# Patient Record
Sex: Female | Born: 1935 | Race: White | Hispanic: No | Marital: Married | Smoking: Never smoker
Health system: Southern US, Community
[De-identification: ages and names within clinical notes are randomized; demographics above are authoritative.]

## PROBLEM LIST (undated history)

## (undated) DIAGNOSIS — I10 Essential (primary) hypertension: Secondary | ICD-10-CM

## (undated) DIAGNOSIS — E039 Hypothyroidism, unspecified: Secondary | ICD-10-CM

---

## 1998-12-14 ENCOUNTER — Other Ambulatory Visit: Admission: RE | Admit: 1998-12-14 | Discharge: 1998-12-14 | Payer: Self-pay | Admitting: Internal Medicine

## 2000-01-01 ENCOUNTER — Other Ambulatory Visit: Admission: RE | Admit: 2000-01-01 | Discharge: 2000-01-01 | Payer: Self-pay | Admitting: Internal Medicine

## 2001-01-12 ENCOUNTER — Other Ambulatory Visit: Admission: RE | Admit: 2001-01-12 | Discharge: 2001-01-12 | Payer: Self-pay | Admitting: Internal Medicine

## 2006-01-01 ENCOUNTER — Encounter: Admission: RE | Admit: 2006-01-01 | Discharge: 2006-01-01 | Payer: Self-pay | Admitting: Internal Medicine

## 2008-03-22 ENCOUNTER — Encounter: Admission: RE | Admit: 2008-03-22 | Discharge: 2008-03-22 | Payer: Self-pay | Admitting: Internal Medicine

## 2008-10-01 ENCOUNTER — Encounter (INDEPENDENT_AMBULATORY_CARE_PROVIDER_SITE_OTHER): Payer: Self-pay | Admitting: Emergency Medicine

## 2008-10-01 ENCOUNTER — Ambulatory Visit: Payer: Self-pay | Admitting: Vascular Surgery

## 2008-10-01 ENCOUNTER — Inpatient Hospital Stay (HOSPITAL_COMMUNITY): Admission: EM | Admit: 2008-10-01 | Discharge: 2008-10-04 | Payer: Self-pay | Admitting: Emergency Medicine

## 2008-10-18 ENCOUNTER — Inpatient Hospital Stay (HOSPITAL_COMMUNITY): Admission: EM | Admit: 2008-10-18 | Discharge: 2008-10-23 | Payer: Self-pay | Admitting: Emergency Medicine

## 2008-10-20 ENCOUNTER — Ambulatory Visit: Payer: Self-pay | Admitting: Infectious Diseases

## 2009-01-28 ENCOUNTER — Inpatient Hospital Stay (HOSPITAL_COMMUNITY): Admission: EM | Admit: 2009-01-28 | Discharge: 2009-01-31 | Payer: Self-pay | Admitting: Emergency Medicine

## 2010-10-21 ENCOUNTER — Encounter: Payer: Self-pay | Admitting: Internal Medicine

## 2011-01-08 LAB — BASIC METABOLIC PANEL
BUN: 23 mg/dL (ref 6–23)
Calcium: 9.1 mg/dL (ref 8.4–10.5)
Calcium: 9.6 mg/dL (ref 8.4–10.5)
Chloride: 107 mEq/L (ref 96–112)
Chloride: 113 mEq/L — ABNORMAL HIGH (ref 96–112)
Creatinine, Ser: 1.29 mg/dL — ABNORMAL HIGH (ref 0.4–1.2)
GFR calc Af Amer: 47 mL/min — ABNORMAL LOW (ref >60)
GFR calc Af Amer: 56 mL/min — ABNORMAL LOW (ref >60)
GFR calc non Af Amer: 39 mL/min — ABNORMAL LOW (ref >60)
GFR calc non Af Amer: 46 mL/min — ABNORMAL LOW (ref >60)
Glucose, Bld: 120 mg/dL — ABNORMAL HIGH (ref 70–99)
Potassium: 3.7 mEq/L (ref 3.5–5.1)

## 2011-01-08 LAB — CARDIAC PANEL(CRET KIN+CKTOT+MB+TROPI)
CK, MB: 1.1 ng/mL (ref 0.3–4.0)
CK, MB: 1.4 ng/mL (ref 0.3–4.0)
Relative Index: INVALID (ref 0.0–2.5)
Relative Index: INVALID (ref 0.0–2.5)
Total CK: 52 U/L (ref 7–177)
Total CK: 53 U/L (ref 7–177)
Troponin I: 0.03 ng/mL (ref 0.00–0.06)

## 2011-01-08 LAB — URINE MICROSCOPIC-ADD ON

## 2011-01-08 LAB — URINALYSIS, ROUTINE W REFLEX MICROSCOPIC
Glucose, UA: NEGATIVE mg/dL
Nitrite: NEGATIVE
Urobilinogen, UA: 0.2 mg/dL (ref 0.0–1.0)

## 2011-01-08 LAB — CBC
Hemoglobin: 12.1 g/dL (ref 12.0–15.0)
MCHC: 33.6 g/dL (ref 30.0–36.0)
MCV: 89.1 fL (ref 78.0–100.0)
Platelets: 231 10*3/uL (ref 150–400)
RBC: 4.06 MIL/uL (ref 3.87–5.11)
RBC: 4.42 MIL/uL (ref 3.87–5.11)
RDW: 15.3 % (ref 11.5–15.5)
WBC: 17.5 10*3/uL — ABNORMAL HIGH (ref 4.0–10.5)

## 2011-01-08 LAB — DIFFERENTIAL
Basophils Absolute: 0 10*3/uL (ref 0.0–0.1)
Basophils Relative: 0 % (ref 0–1)
Eosinophils Absolute: 0 10*3/uL (ref 0.0–0.7)
Eosinophils Relative: 0 % (ref 0–5)
Monocytes Absolute: 0.3 10*3/uL (ref 0.1–1.0)
Monocytes Absolute: 1.3 10*3/uL — ABNORMAL HIGH (ref 0.1–1.0)
Monocytes Relative: 3 % (ref 3–12)
Monocytes Relative: 7 % (ref 3–12)
Neutro Abs: 14.9 10*3/uL — ABNORMAL HIGH (ref 1.7–7.7)

## 2011-01-08 LAB — CULTURE, BLOOD (ROUTINE X 2)

## 2011-01-08 LAB — URIC ACID
Uric Acid, Serum: 2.5 mg/dL (ref 2.4–7.0)
Uric Acid, Serum: 3.1 mg/dL (ref 2.4–7.0)

## 2011-01-08 LAB — TROPONIN I: Troponin I: 0.03 ng/mL (ref 0.00–0.06)

## 2011-01-14 LAB — CBC
HCT: 32.3 % — ABNORMAL LOW (ref 36.0–46.0)
HCT: 33.4 % — ABNORMAL LOW (ref 36.0–46.0)
HCT: 35.6 % — ABNORMAL LOW (ref 36.0–46.0)
HCT: 28.7 % — ABNORMAL LOW (ref 36.0–46.0)
HCT: 30.4 % — ABNORMAL LOW (ref 36.0–46.0)
HCT: 32 % — ABNORMAL LOW (ref 36.0–46.0)
HCT: 34.4 % — ABNORMAL LOW (ref 36.0–46.0)
Hemoglobin: 11 g/dL — ABNORMAL LOW (ref 12.0–15.0)
Hemoglobin: 11.7 g/dL — ABNORMAL LOW (ref 12.0–15.0)
Hemoglobin: 11.7 g/dL — ABNORMAL LOW (ref 12.0–15.0)
Hemoglobin: 10.1 g/dL — ABNORMAL LOW (ref 12.0–15.0)
Hemoglobin: 10.6 g/dL — ABNORMAL LOW (ref 12.0–15.0)
Hemoglobin: 9.9 g/dL — ABNORMAL LOW (ref 12.0–15.0)
MCHC: 32.8 g/dL (ref 30.0–36.0)
MCHC: 32.9 g/dL (ref 30.0–36.0)
MCHC: 33.3 g/dL (ref 30.0–36.0)
MCHC: 33.2 g/dL (ref 30.0–36.0)
MCV: 92.1 fL (ref 78.0–100.0)
MCV: 92.3 fL (ref 78.0–100.0)
MCV: 89.7 fL (ref 78.0–100.0)
MCV: 90.8 fL (ref 78.0–100.0)
MCV: 91.4 fL (ref 78.0–100.0)
Platelets: 260 10*3/uL (ref 150–400)
Platelets: 333 10*3/uL (ref 150–400)
Platelets: 309 10*3/uL (ref 150–400)
RBC: 3.87 MIL/uL (ref 3.87–5.11)
RBC: 3.3 MIL/uL — ABNORMAL LOW (ref 3.87–5.11)
RBC: 3.79 MIL/uL — ABNORMAL LOW (ref 3.87–5.11)
RDW: 13.9 % (ref 11.5–15.5)
RDW: 14.1 % (ref 11.5–15.5)
RDW: 14.2 % (ref 11.5–15.5)
RDW: 15.2 % (ref 11.5–15.5)
RDW: 15.3 % (ref 11.5–15.5)
RDW: 15.4 % (ref 11.5–15.5)
WBC: 10.9 10*3/uL — ABNORMAL HIGH (ref 4.0–10.5)
WBC: 11.6 10*3/uL — ABNORMAL HIGH (ref 4.0–10.5)
WBC: 15 10*3/uL — ABNORMAL HIGH (ref 4.0–10.5)
WBC: 18 10*3/uL — ABNORMAL HIGH (ref 4.0–10.5)

## 2011-01-14 LAB — DIFFERENTIAL
Basophils Absolute: 0 10*3/uL (ref 0.0–0.1)
Basophils Absolute: 0 10*3/uL (ref 0.0–0.1)
Basophils Relative: 0 % (ref 0–1)
Basophils Relative: 0 % (ref 0–1)
Eosinophils Absolute: 0 10*3/uL (ref 0.0–0.7)
Eosinophils Relative: 0 % (ref 0–5)
Eosinophils Relative: 0 % (ref 0–5)
Lymphocytes Relative: 7 % — ABNORMAL LOW (ref 12–46)
Lymphocytes Relative: 8 % — ABNORMAL LOW (ref 12–46)
Lymphocytes Relative: 6 % — ABNORMAL LOW (ref 12–46)
Lymphs Abs: 1.4 10*3/uL (ref 0.7–4.0)
Monocytes Absolute: 1.5 10*3/uL — ABNORMAL HIGH (ref 0.1–1.0)
Monocytes Absolute: 1.8 10*3/uL — ABNORMAL HIGH (ref 0.1–1.0)
Monocytes Absolute: 1.4 10*3/uL — ABNORMAL HIGH (ref 0.1–1.0)
Monocytes Relative: 10 % (ref 3–12)
Neutro Abs: 14.4 10*3/uL — ABNORMAL HIGH (ref 1.7–7.7)

## 2011-01-14 LAB — HEPATIC FUNCTION PANEL
AST: 43 U/L — ABNORMAL HIGH (ref 0–37)
Bilirubin, Direct: 0.2 mg/dL (ref 0.0–0.3)
Total Bilirubin: 0.9 mg/dL (ref 0.3–1.2)

## 2011-01-14 LAB — COMPREHENSIVE METABOLIC PANEL
AST: 16 U/L (ref 0–37)
Alkaline Phosphatase: 76 U/L (ref 39–117)
BUN: 20 mg/dL (ref 6–23)
CO2: 23 mEq/L (ref 19–32)
Chloride: 103 mEq/L (ref 96–112)
Creatinine, Ser: 1.42 mg/dL — ABNORMAL HIGH (ref 0.4–1.2)
GFR calc Af Amer: 42 mL/min — ABNORMAL LOW (ref >60)
GFR calc non Af Amer: 35 mL/min — ABNORMAL LOW (ref >60)
Potassium: 3.8 mEq/L (ref 3.5–5.1)
Total Bilirubin: 1.2 mg/dL (ref 0.3–1.2)

## 2011-01-14 LAB — SEDIMENTATION RATE
Sed Rate: 40 mm/hr — ABNORMAL HIGH (ref 0–22)
Sed Rate: 80 mm/hr — ABNORMAL HIGH (ref 0–22)

## 2011-01-14 LAB — BASIC METABOLIC PANEL
BUN: 35 mg/dL — ABNORMAL HIGH (ref 6–23)
BUN: 36 mg/dL — ABNORMAL HIGH (ref 6–23)
BUN: 50 mg/dL — ABNORMAL HIGH (ref 6–23)
CO2: 23 mEq/L (ref 19–32)
Calcium: 8.7 mg/dL (ref 8.4–10.5)
Calcium: 8.6 mg/dL (ref 8.4–10.5)
Chloride: 105 mEq/L (ref 96–112)
Chloride: 106 mEq/L (ref 96–112)
Chloride: 108 mEq/L (ref 96–112)
Creatinine, Ser: 1.29 mg/dL — ABNORMAL HIGH (ref 0.4–1.2)
Creatinine, Ser: 1.64 mg/dL — ABNORMAL HIGH (ref 0.4–1.2)
GFR calc Af Amer: 28 mL/min — ABNORMAL LOW (ref >60)
GFR calc Af Amer: 47 mL/min — ABNORMAL LOW (ref >60)
GFR calc non Af Amer: 23 mL/min — ABNORMAL LOW (ref >60)
GFR calc non Af Amer: 24 mL/min — ABNORMAL LOW (ref >60)
GFR calc non Af Amer: 28 mL/min — ABNORMAL LOW (ref >60)
GFR calc non Af Amer: 39 mL/min — ABNORMAL LOW (ref >60)
Glucose, Bld: 115 mg/dL — ABNORMAL HIGH (ref 70–99)
Glucose, Bld: 150 mg/dL — ABNORMAL HIGH (ref 70–99)
Glucose, Bld: 209 mg/dL — ABNORMAL HIGH (ref 70–99)
Potassium: 3.4 mEq/L — ABNORMAL LOW (ref 3.5–5.1)
Potassium: 3.8 mEq/L (ref 3.5–5.1)
Potassium: 3.9 mEq/L (ref 3.5–5.1)
Sodium: 135 mEq/L (ref 135–145)
Sodium: 137 mEq/L (ref 135–145)
Sodium: 137 mEq/L (ref 135–145)

## 2011-01-14 LAB — CORTISOL: Cortisol, Plasma: 24.1 ug/dL

## 2011-01-14 LAB — POCT I-STAT, CHEM 8
Chloride: 102 mEq/L (ref 96–112)
HCT: 37 % (ref 36.0–46.0)
Hemoglobin: 12.6 g/dL (ref 12.0–15.0)
Potassium: 3.4 mEq/L — ABNORMAL LOW (ref 3.5–5.1)
Sodium: 139 mEq/L (ref 135–145)

## 2011-01-14 LAB — CULTURE, BLOOD (ROUTINE X 2)

## 2011-01-14 LAB — TSH
TSH: 3.671 u[IU]/mL (ref 0.350–4.500)
TSH: 1.295 u[IU]/mL (ref 0.350–4.500)

## 2011-01-14 LAB — PROTIME-INR: INR: 1.1 (ref 0.00–1.49)

## 2011-01-14 LAB — GLUCOSE, CAPILLARY

## 2011-01-14 LAB — BRAIN NATRIURETIC PEPTIDE: Pro B Natriuretic peptide (BNP): 62.7 pg/mL (ref 0.0–100.0)

## 2011-01-14 LAB — ANA: Anti Nuclear Antibody(ANA): NEGATIVE

## 2011-01-14 LAB — URINE CULTURE

## 2011-01-14 LAB — APTT: aPTT: 38 seconds — ABNORMAL HIGH (ref 24–37)

## 2011-01-14 LAB — RHEUMATOID FACTOR: Rhuematoid fact SerPl-aCnc: <20 IU/mL (ref 0–20)

## 2011-02-12 NOTE — Discharge Summary (Signed)
[name], [name]           ACCOUNT NO.:  [id number]   MEDICAL RECORD NO.:  [id number]          PATIENT TYPE:  INP   LOCATION:  [id number]                         FACILITY:  [facility]   PHYSICIAN:  Dwaine Deter, M.D.DATE OF BIRTH:  [birth year]   DATE OF ADMISSION:  01/28/2009  DATE OF DISCHARGE:  01/31/2009                               DISCHARGE SUMMARY   DISCHARGE DIAGNOSES:  1. Generalized weakness - improved.  Question etiology.  May have been      medication related.  Crestor on hold and Uloric reduced to 20 mg      daily for gout.  2. Leukocytosis, resolved.  White count 17,500 on admission and on      second day of the admission 8900.  3. Nocturnal confusion - this occurred night prior to discharge.      Improved on the morning of discharge.  Likely sundowning.  4. History of gout with some erythema and warmth to the great toes      bilaterally, which improved during the hospitalization.  The      patient had one dose of prednisone 20 mg on Jan 30, 2009, then 10 mg      on Jan 31, 2009, to take for 3 more days.  5. Hypertension - well controlled on Toprol.  Lisinopril was held.  6. Bradycardia - improved with reduction of Toprol to 50 mg daily with      pulse on discharge of 81.   The patient also has a history of spinal stenosis and gait disorder  secondary to that.  She has chronic renal insufficiency with discharge  BUN 16 and creatinine 1.11.   HOSPITAL COURSE:  [name] is a very pleasant 77 year old  female with history of spinal stenosis and has had 2 admissions earlier  this year for gouty flares.  At each admission, there was a question  whether this might be cellulitis and after Infectious Disease  consultation with Dr. Lars Mage, it was felt that she was likely  having gouty flares.  She presented on Jan 28, 2009, with an apparent  progressive weakness and could not get out of the bed on the morning of  admission.  She had had decreased p.o.  intake and she apparently had had  an upper respiratory virus couple of weeks prior to admission.  She  denied any fevers or chills.  No nausea, vomiting, or diarrhea, just  weakness.  She also denied chest pain, shortness of breath, abdominal  pain, or any focal neurologic symptoms, cough or dysuria.  She had had  some loose stools a week prior to admission, which resolved.   The patient was seen in the [facility] and found to have  a white count of 17,500 with an increased absolute neutrophil count.  Creatinine was mildly elevated at 1.21.  She was given prednisone p.o.  x1 at the emergency room and was admitted.   On admission, her lisinopril was held.  Vital signs were stable.  Crestor, Toprol, aspirin, calcium, and Uloric were continued.   With IV hydration, she rapidly improved and mild foot  pain was  persistent at the MTP joints with some very minimal swelling.  She was  given prednisone 20 mg x1 on Jan 30, 2009, and colchicine was started at  0.6 mg daily.  On the night of Jan 30, 2009, she became confused but  vital signs were stable.  There was some paranoia.  She responded very  well to Seroquel 25 mg p.o. x1 and is calm at the time of discharge on  Jan 31, 2009.   The patient will be discharged home in improved condition and she likely  was having some sundowning with the addition of prednisone therapy as  well.   CONDITION ON DISCHARGE:  Improved with questionable etiology for  confusion and weakness but could have been medication related and her  Toprol and Uloric have been decreased and her Crestor has been placed on  hold.  I will see her back in the office in 1 week and p.r.n.      Dwaine Deter, M.D.  Electronically Signed     RNG/MEDQ  D:  01/31/2009  T:  01/31/2009  Job:  [id number]

## 2011-02-12 NOTE — H&P (Signed)
NAMEMAYRELI, PFUHL NO.:  [id number]   MEDICAL RECORD NO.:  [id number]          PATIENT TYPE:  EMS   LOCATION:  ED                           FACILITY:  [facility]   PHYSICIAN:  Irine Seal, MD    DATE OF BIRTH:  10-Apr-1918   DATE OF ADMISSION:  01/28/2009  DATE OF DISCHARGE:                              HISTORY & PHYSICAL   PRIMARY CARE PHYSICIAN:  Dwaine Deter, M.D. of [place].   HISTORY OF PRESENT ILLNESS:  [name] is an 77 year old white  female with a history of spinal stenosis, 2 prior admissions in January  of this year for gouty attack/flares, history of hypertension,  hyperlipidemia, gait disorder, presented to the ED with generalized  weakness.  Per husband, the patient just could not get out of bed this  morning and was just globally weak.  The patient had had some decreased  p.o. intake and had an upper respiratory viral infection 2 weeks prior  to admission.  The patient denies any fevers.  No chills, no nausea, no  vomiting, no diarrhea, no chest pain, no shortness of breath, no  abdominal pain, no focal neurological symptoms, no cough, no dysuria, no  headaches.  The patient does endorse some loose stools 1 week prior to  admission which have since resolved.  Per family, the patient has not  had a bowel movement in 2 days.  The patient was seen in the ED.  Chest  x-ray done was negative.  CBC showed a white count of 17.5 with an ANC  of 14.9, and was otherwise within normal limits.  BMET obtained showed a  creatinine of 1.21 but was otherwise within normal limits.  Urinalysis  done was bland.  The patient was given 40 mg of prednisone orally x1 and  will call to admit the patient for further evaluation and  recommendations.   ALLERGIES:  No known drug allergies.   PAST MEDICAL HISTORY:  1. Prior hospitalizations in January 2010 for probable gouty      attacks/cellulitis.  2. Hypertension.  3. Hyperlipidemia.  4.  Gait disorder.  5. History of spinal stenosis/degenerative joint disease.  6. History of mild renal insufficiency with a baseline creatinine 1.5-      1.7.  7. History of gout.   HOME MEDICATIONS:  1. Crestor 5 mg p.o. daily.  2. Lisinopril 10 mg p.o. daily.  3. Toprol XL 100 mg p.o. daily.  4. Aspirin 81 mg p.o. daily.  5. Fish oil 2400 units one capsule daily.  6. Calcium with vitamin D daily.  7. Uloric 40 mg p.o. daily.   SOCIAL HISTORY:  The patient is married, lives in [place] with her  husband.  No current tobacco use, quit 25 years ago.  Social alcohol  use.  No IV drug use.   FAMILY HISTORY:  Noncontributory.   REVIEW OF SYSTEMS:  As per HPI, otherwise negative.   PHYSICAL EXAMINATION:  VITAL SIGNS:  Temperature 99.2, blood pressure  145/68, pulse of 66, respirations 18, saturations 96% on room air.  GENERAL:  Patient lying on gurney in no apparent distress.  HEENT:  Normocephalic, atraumatic.  Pupils equal, round and reactive to  light and accommodation.  Extraocular movements intact.  Oropharynx is  clear.  No lesions, no exudates.  NECK:  Supple.  No lymphadenopathy.  Dry mucous membranes.  RESPIRATORY:  Lungs are clear to auscultation bilaterally.  CARDIOVASCULAR:  Regular rate and rhythm.  No murmurs, rubs or gallops.  ABDOMEN:  Soft, nontender, nondistended.  Positive bowel sounds.  EXTREMITIES:  No clubbing, cyanosis.  Bilateral MTP joints are  erythematous, slightly tender to palpation and warm.  NEUROLOGICAL:  The patient is alert and oriented x3.  Cranial nerves II-  XII are grossly intact.  No focal deficits.   ADMISSION LABS:  CBC:  White count 17.5, hemoglobin 13.2, hematocrit  39.6, platelet count of 231, ANC of 14.9.  Basic metabolic profile:  Sodium 138, potassium 3.7, chloride 105, bicarb 25, glucose 120, BUN 16,  creatinine 1.21 and a calcium of 9.6.  UA was yellow, clear, specific  gravity 1.018, pH of 6.5, glucose negative, bilirubin negative,  trace  ketones, blood small, protein greater than 300, urobilinogen 0.2,  nitrite negative, leukocytes negative.  Urine microscopy:  RBCs 0-1.  Chest x-ray obtained showed cardiomegaly with chronic interstitial  changes.  EKG with normal sinus rhythm with Q-waves in leads III and  aVF.   ASSESSMENT AND PLAN:  [name] is an 77 year old female with  history of gout, hyperlipidemia, hypertension, gait disorder, spinal  stenosis, mild renal insufficiency presenting to the ED with a  generalized weakness.  1. Generalized weakness, questionable etiology.  Differential includes      acute coronary syndrome versus debility versus an infectious      etiology versus endocrine etiology.  We will cycle cardiac enzymes      q.8 h x3.  Check a TSH.  Check blood cultures x2.  Check      orthostatics.  Hydrate with IV fluids, PT, OT and follow.  2. Leukocytosis likely secondary to gout (as the patient does have      bilateral metatarsophalangeal great toe joints are erythematous,      warm with some tenderness to palpation) versus septic joint versus      bacteremia.  Urinalysis was negative.  Chest x-ray was negative.      Check blood cultures x2.  Check a uric acid level.  Get x-rays of      the bilateral feet to rule out an effusion.  Empiric IV vancomycin      and Indocin for now.  Continue home dose of Uloric and follow.  3. Mild dehydration.  IV fluids.  4. Hypertension.  Continue home dose of Toprol XL.  We will hold      lisinopril for now.  5. Chronic renal insufficiency.  Baseline creatinine is 1.5-1.7,      currently at baseline.  We will follow.  6. Hyperlipidemia.  Continue home dose of Crestor.  7. Spinal stenosis.  8. Gait disorder.  9. Prophylaxis:  Protonix for GI prophylaxis.  Lovenox for DVT      prophylaxis.   It has been a pleasure taking care of [name].      Irine Seal, MD  Electronically Signed     DT/MEDQ  D:  01/28/2009  T:   01/28/2009  Job:  [id number]   cc:   Dwaine Deter, M.D.  Fax: [phone]

## 2011-02-12 NOTE — H&P (Signed)
[name], [name]           ACCOUNT NO.:  [id number]   MEDICAL RECORD NO.:  [id number]          PATIENT TYPE:  EMS   LOCATION:  ED                           [facility]ACILITY:  [facility]   PHYSICIAN:  Corinna L. Conley Canal, MDDATE OF BIRTH:  30-May-1918   DATE OF ADMISSION:  10/01/2008  DATE OF DISCHARGE:                              HISTORY & PHYSICAL   CHIEF COMPLAINT:  Cannot walk.   HISTORY OF PRESENT ILLNESS:  Ms. Brinley is a pleasant 77 year old white  female who presents with sudden onset of left ankle pain.  She reports  that it started all of a sudden last night.  She is unable to bear  weight on her left leg due to the ankle pain.  She has had no fevers or  chills.  Her husband reports that previously her right foot and ankle  were swollen but there was no pain.  She, at some point over the past 6  months, has been diagnosed with presumed gout.  Apparently she has never  had arthrocentesis.  She was placed on an unknown gout medication which  she has since stopped.  She was given samples of something and they have  run out.  The patient was also reportedly started on a course of unknown  antibiotic for the right ankle and foot swelling.  The patient also has  reportedly been seen in the office for her alternating foot and ankle  pain.  The husband reports that her appetite has also been poor.  She  has been very weak and has had difficulty walking even prior to this  acute attack last night.  She had been walking without a walker but now  requires a walker to get around.  I see that she has had a uric acid  level drawn as an outpatient but no other labs that I know of.  Outpatient records are unavailable.   PAST MEDICAL HISTORY:  Hypertension, hyperlipidemia, presumed gout.   MEDICATIONS:  Crestor 5 mg a day, lisinopril/hydrochlorothiazide 20/12.5  mg a day, Toprol XL 100 mg a day, aspirin 81 mg a day, a multivitamin a  day, fish oil capsules, calcium with vitamin D.   SOCIAL  HISTORY:  The patient is married and here with her husband.  She  quit smoking 25 years ago and used to smoke less than half a pack a day.  She drinks socially occasionally.   FAMILY HISTORY:  Noncontributory.   REVIEW OF SYSTEMS:  As above otherwise negative.   On physical examination temperature is 98.1, blood pressure 129/72,  pulse 83, respiratory rate 20, oxygen saturation 99% on room air.  In general the patient is an elderly white female in no acute distress.  HEENT:  Normocephalic, atraumatic.  Pupils equal, round, reactive to  light.  Sclerae nonicteric.  Moist mucous membranes.  Neck is supple.  No lymphadenopathy.  Lungs are clear to auscultation bilaterally without wheezes, rhonchi or  rales.  CARDIOVASCULAR:  Regular rate and rhythm without murmurs, gallops or  rubs.  ABDOMEN:  Soft, nontender, nondistended.  GU and rectal were deferred.  EXTREMITIES:  The right ankle,  she reports pain with flexion in the  Achilles area.  She has some erythema over her right ankle and right  first MTP joint but no tenderness to palpation.  Her left ankle has pain  with flexion posteriorly, minimal tenderness over the ankle joint,  minimal tenderness over the left third MTP, slight nonpitting edema of  the ankle and distal lower extremity.  No erythema, no warmth.  I did  not have the patient walk but according to the ED physician, she cried  out in pain with any weightbearing to the left leg.  Motor strength is  5/5 throughout.  NEUROLOGIC:  Alert and oriented.  Cranial nerves and sensory motor exam  were intact.  Decreased but symmetric deep tendon reflexes.  SKIN:  No rash.  No purpura.  Otherwise as above.   LABS:  White blood cell count is 17,000, hemoglobin 11.7, hematocrit  35.6, platelet count 331, 82% neutrophils, 8% lymphocytes, 10%  monocytes, 0 eosinophils, erythrocyte sedimentation rate is 85, PTT is  38, INR 1.3.  Sodium 139, potassium 3.4, chloride 102, glucose 131,  BUN  29, creatinine 2.0.  Her baseline creatinine per outpatient labs is  about 1.6.  Bicarbonate 28.  Uric acid 5.3.  BNP 62.7.  Doppler of both  legs negative for DVT.  Chest x-ray shows probable COPD, nothing acute,  borderline cardiomegaly.  Official reading of the left ankle x-ray is  pending but to my view shows no fracture.   ASSESSMENT/PLAN:  1. Acute left ankle pain with difficulty walking:  Certainly infection      is a possibility as well as gout.  She is afebrile, has no erythema      or warmth to the joint but does have a leukocytosis.  The ED      physician recommended aspiration of the ankle joint but the patient      has adamantly refused.  For now it is reasonable to admit her and      start empiric vancomycin, monitor labs, get physical therapy and      occupational therapy consult.  I will also give empiric colchicine      in small doses due to renal insufficiency as well as 2 doses of      indomethacin.  Also, given the migratory nature of her swelling and      pain, as well as her weakness and constitutional symptoms, I will      order a TSH, ANA, C-reactive protein, CPK, and LFTs to rule out      other causes.  I will also give p.r.n. Dilaudid.  2. Acute on chronic renal insufficiency.  She will have her      hydrochlorothiazide held.  3. Hypertension.  Continue other outpatient medications.  4. Hyperlipidemia.  Continue Crestor.  5. Questionable history of gout.   If the patient's symptoms continue, the patient will need to reconsider  aspiration of the joint to rule out septic joint and certainly to look  for crystals as the diagnosis apparently was not based on crystal  examination.  Also, her official x-ray report will need to be followed  up.  She will get ulcer and DVT prophylaxis.      Corinna L. Conley Canal, MD  Electronically Signed     CLS/MEDQ  D:  10/01/2008  T:  10/01/2008  Job:  [id number]   cc:   Mertha Finders, MD

## 2011-02-12 NOTE — H&P (Signed)
[name], [name]           ACCOUNT NO.:  [id number]   MEDICAL RECORD NO.:  [id number]          PATIENT TYPE:  INP   LOCATION:  0110                         [facility]AC[facility]  [facility]   PHYSICIAN:  Annita Brod, M.D.DATE OF BIRTH:  [birth year]   DATE OF ADMISSION:  10/18/2008  DATE OF DISCHARGE:                              HISTORY & PHYSICAL   The patient's PCP is Dr. Mertha Finders.   CHIEF COMPLAINT:  Foot pain.   HISTORY OF PRESENT ILLNESS:  The patient is an 77 year old white female  with past medical history of hypertension and hyperlipidemia who was on  Dr. Inda Merlin' in-hospital service from the 2nd of this month to the 5th.  At that time she came in for a swollen erythematous first MTP which was  felt to be a combination of gout and/or cellulitis.  She was treated  with IV vancomycin initially tin he emergency room and then was able to  be changed over to p.o. doxycycline.  She completed her 2-week course of  doxycycline.  A serum uric acid level was checked at the time of  admission and found to be negative, but it was suspected that there may  be some underlying gout because of how acutely red and sensitive the  area was.  She had been put on a prednisone taper.  She finished the  prednisone taper after several days.  She will finish the doxycycline  course after today.  She had redness that persisted but for the most  part was otherwise pain free.  Then starting early this morning her foot  was so painful that when she tried to stand and put pressure on it, she  immediately fell backwards. She could not stand to have any pressure on  it.  She came in the emergency room where she was evaluated.  She was  found to have a white count of 18 when her white blood cell count at  time of discharge 2 weeks ago was down to 10.  Her sed rate was actually  down to 40 today when previously it had been 80 before.  Currently the  patient is doing okay.  She denies any headaches, vision  changes,  dysphagia, chest pain, palpitations, shortness of breath, wheeze, cough,  abdominal pain, hematuria, dysuria, diarrhea, focal extremity numbness.  weakness or pain.  This includes her right foot.  It is actually not  tender unless she tries to bear weight on it, but with palpation it is  actually not tender.   PAST MEDICAL HISTORY:  1. Includes recent course of cellulitis plus/minus gout.  2. Hyperlipidemia.  3. Hypertension.   MEDICATION:  1. She is on Crestor 5.  2. Lisinopril 10.  3. Toprol XL 100.  4. Aspirin 81.  5. Multivitamin daily.  6. She just finished a prednisone taper about 10 days ago.  7. She just finished a doxycycline course of antibiotics today.  8. Os-Cal D b.i.d.  9. Fish oil capsules daily.   ALLERGIES:  No known drug allergies.   SOCIAL HISTORY:  She denies any tobacco, alcohol or drug use.   FAMILY HISTORY:  Noncontributory.   PHYSICAL EXAMINATION:  VITALS:  On admission temperature 99.1, heart  rate 80, blood pressure 137/45, respirations 12, O2 sat 97% on room air.  GENERAL:  She is alert and oriented x3 in no apparent distress.  HEENT:  Normocephalic, atraumatic.  Mucous membranes are moist.  She has  no carotid bruits.  HEART:  Regular rate and rhythm.  S1, S2.  LUNGS:  Clear to auscultation bilaterally.  ABDOMEN:  Soft, nontender, nondistended.  Positive bowel sounds.  EXTREMITIES:  Showed no clubbing, cyanosis.  Trace pitting edema.  Her  right foot at the area of the first MTP is erythematous.  It is swollen  but actually it is nontender at this time.  However, when the patient  goes to weightbearing status that is when she said that this hurts.   LABORATORY DATA:  White count  is 18, H and H 11 and 34, MCV 91,  platelet count 243, 86% shift.  Uric acid 4.6, noting previous uric acid  was normal.  Sed rate today is 40, previously at 88.  Her white blood  cell count 2 weeks ago was 10.   X-RAYS:  A review of the left foot shows  soft tissue swelling and no  plain evidence of osteo status post bunionectomy with first MTP DJD.   ASSESSMENT/PLAN:  1. Recurrent foot pain with suspected recurrent cellulitis.  We will      go ahead and check an MRI to rule out osteo.  The only previous      radiology films have been x-rays and given the fact that she has an      elevated white count, I suspect a recurrence of infection despite      completion of antibiotics  We will also go back and start her on      vancomycin as she has only been on vancomycin for 2-3 days during      her hospitalization and p.o. doxycycline after that.  2. History of gout.  No evidence of at this time.  We will continue to      follow.  3. Hypertension.      Annita Brod, M.D.  Electronically Signed     SKK/MEDQ  D:  10/18/2008  T:  10/18/2008  Job:  [id number]   cc:   Dwaine Deter, M.D.  Fax: [phone]

## 2011-02-12 NOTE — Discharge Summary (Signed)
[name], West           ACCOUNT NO.:  [id number]   MEDICAL RECORD NO.:  [id number]          PATIENT TYPE:  INP   LOCATION:  [id number]                         FACILITY:  [facility]   PHYSICIAN:  Dwaine Deter, M.D.DATE OF BIRTH:  09/13/1918   DATE OF ADMISSION:  10/01/2008  DATE OF DISCHARGE:  10/04/2008                               DISCHARGE SUMMARY   DISCHARGE DIAGNOSES:  1. Left ankle pain, resolved, probable gout, but cannot rule out early      cellulitis.  2. Hypertension.  3. Hyperlipidemia.  4. Gait disorder, multifactorial.  5. History of spinal stenosis.  6. Mild renal insufficiency with creatinine generally in the 1.5-1.7      range.  7. One out of two blood cultures positive for Gram-positive cocci in      clusters, sensitivities pending.   DISCHARGE MEDICATIONS:  1. Crestor 5 mg daily.  2. Lisinopril 20 mg daily, but we will be reducing to 10 mg daily.  3. Toprol XL 100 mg daily.  4. Aspirin 81 mg daily.  5. Multivitamin daily.  6. Prednisone 10 mg daily for 3 days, then discontinue.  7. Calcium with vitamin D b.i.d.  8. Fish oil capsules 3 g daily.   PROCEDURES:  Two x-rays of the ankle were performed on January 2 and  October 03, 2008.  There was mild diffuse soft tissue swelling about the  ankle on January 4 with no fracture, dislocation or other acute bony  injury.  Ankle mortise was intact.  No focal cortical destruction.  K-  wires were noted in the first metatarsal which were incompletely  evaluated.   LABORATORIES WERE AS FOLLOWS:  BMET on October 03, 2008 revealed a sodium  139, potassium 3.4, chloride 105, bicarb 25, glucose 157, BUN 48,  creatinine 2.06.  Blood cultures, 1/2 grew Gram-positive cocci in  clusters on October 01, 2008.  CBC from October 02, 2008 revealed white  count 10,500, hemoglobin 10.7, platelet count 260,000.  C-reactive  protein was 22.2 on October 01, 2008.  TSH was 3.671.  LFTs were within  normal limits except for slightly  elevated AST of 43, CK was 43 units  per liter, normal.  CBC revealed a white count at 15,800 on October 01, 2008 with 84% neutrophils, and sedimentation rate was 85 mm per hour.  BNP on October 01, 2008 was 63, pro time 14.1, PTT 38 seconds.  Uric acid  level on October 01, 2008 was normal at 5.3.   HOSPITAL COURSE:  [name] is a very pleasant 77 year old female who  had sudden onset of left ankle pain on the night prior to admission.  This occurred on September 30, 2008.  She was unable to bear weight on her  left leg due to the ankle pain.  She has had previous pain in this ankle  presumed to be gout.  She has never had arthrocentesis.  She was started  on Uloric 40 mg daily after treatment for gout recently and she had run  out of this prior to this admission.  She was admitted and initially  treated with vancomycin and  colchicine with resolution of pain and  erythema and swelling at the left ankle.  At the time of discharge she  is ambulating much better and currently off vancomycin since October 02, 2008 and on prednisone 10 mg b.i.d. and colchicine 0.6 mg daily.   One out of 2 blood cultures was positive for Gram-positive cocci in  clusters, which was likely a contaminant.   Will plan to discharge on doxycycline 100 mg b.i.d. for a total of 14  days and decrease prednisone to 10 mg for 3 days and then discontinue.   She will be discharged off hydrochlorothiazide which could have caused  the presumed gouty exacerbation.   Question of cellulitis and possibly osteomyelitis are still possible,  and we will be following clinically as an outpatient.   She was ambulating well with a walker on the day prior to discharge.  She will be discharged home with home health nursing and physical  therapy and occupational therapy.   CONDITION ON DISCHARGE:  Much improved.   VITAL SIGNS: At the time discharge revealed a temperature of 97.1 and  she had been afebrile for several days.  She  had been afebrile during  her hospitalization.  Pulse 55 and regular, respiratory rate 16 and  nonlabored, blood pressure 110/52.   For mild azotemia, we will be reducing lisinopril from 20 mg to 10 mg  daily and will reassess BMET in 1 week in my office.      Dwaine Deter, M.D.  Electronically Signed     RNG/MEDQ  D:  10/04/2008  T:  10/04/2008  Job:  [id number]

## 2011-02-15 NOTE — Discharge Summary (Signed)
[name], [name]           ACCOUNT NO.:  [id number]   MEDICAL RECORD NO.:  [id number]          PATIENT TYPE:  INP   LOCATION:  [id number]                         FACILITY:  [facility]   PHYSICIAN:  Dwaine Deter, M.D.DATE OF BIRTH:  [birth year]   DATE OF ADMISSION:  10/18/2008  DATE OF DISCHARGE:  10/23/2008                               DISCHARGE SUMMARY   DISCHARGE DIAGNOSES:  1. Left foot gout.  2. Hypertension.  3. Azotemia.  4. Degenerative joint disease and spinal stenosis.  5. Hyperlipidemia.   DISCHARGE MEDICATIONS:  1. Colchicine 0.6 mg p.o. daily.  2. Prednisone taper 10 mg three tablets for 2 days, then two tablets      for 2 days, and one tablet for two days, then discontinue.  3. Crestor 5 mg daily.  4. Lisinopril 10 mg daily.  5. Toprol XL 100 mg daily.  6. Aspirin 81 mg daily.  7. Multivitamin daily.  8. Calcium with vitamin D supplement daily.  9. Fish oil 2 grams daily.   HOSPITAL COURSE:  [name] is a very pleasant 77 year old female with  a history of hypertension, hyperlipidemia who presented with a swollen  first MTP joint for which she was treated with IV vancomycin on previous  hospitalization in the month.  She then completed a 2-week course of  doxycycline and a serum uric acid level at the previous hospitalization  was found to be normal.  She was put on a prednisone taper.  She  finished a course of doxycycline with the thought that this might be a  cellulitis, but pain has recurred.  She presented to the [facility]  Emergency Room on October 18, 2008 and her white count was 18,000 and  sed rate had risen from 10 mm per hour to 40 mm per hour.  She was  admitted with the thought that this might be osteomyelitis and restarted  on vancomycin and doxycycline.   She was not improving to any degree over the first couple of days while  hospitalized and she was seen in consultation by Dr. Eulis Canner who felt  that this was almost certainly gout  and she responded nicely to steroids  and colchicine.  She was able to be discharged home in improved  condition on October 23, 2008 on the above medications.   DISCHARGE LABORATORIES:  White count 16,700, hemoglobin 9.6, platelet  count 309,000.  Sed rate was 80 mm per hour on October 09, 2008.  Sodium  was 138, potassium 3.8, chloride 108, bicarb 22, glucose 150, BUN 50,  creatinine 1.64 on discharge.  TSH was 1.295 and cortisol level on  October 18, 2008 was 24.1.  Rheumatoid factor was less than 20 and urine  culture revealed no growth.   CONDITION ON DISCHARGE:  Improved with follow-up in the office to be in  1 week.      Dwaine Deter, M.D.  Electronically Signed     RNG/MEDQ  D:  12/21/2008  T:  12/21/2008  Job:  [id number]

## 2011-10-03 DIAGNOSIS — C44721 Squamous cell carcinoma of skin of unspecified lower limb, including hip: Secondary | ICD-10-CM | POA: Diagnosis not present

## 2011-10-23 DIAGNOSIS — H02839 Dermatochalasis of unspecified eye, unspecified eyelid: Secondary | ICD-10-CM | POA: Diagnosis not present

## 2011-10-23 DIAGNOSIS — H40019 Open angle with borderline findings, low risk, unspecified eye: Secondary | ICD-10-CM | POA: Diagnosis not present

## 2012-01-27 DIAGNOSIS — E559 Vitamin D deficiency, unspecified: Secondary | ICD-10-CM | POA: Diagnosis not present

## 2012-01-27 DIAGNOSIS — H6120 Impacted cerumen, unspecified ear: Secondary | ICD-10-CM | POA: Diagnosis not present

## 2012-01-27 DIAGNOSIS — M109 Gout, unspecified: Secondary | ICD-10-CM | POA: Diagnosis not present

## 2012-01-27 DIAGNOSIS — I10 Essential (primary) hypertension: Secondary | ICD-10-CM | POA: Diagnosis not present

## 2012-01-27 DIAGNOSIS — Z79899 Other long term (current) drug therapy: Secondary | ICD-10-CM | POA: Diagnosis not present

## 2012-01-27 DIAGNOSIS — E785 Hyperlipidemia, unspecified: Secondary | ICD-10-CM | POA: Diagnosis not present

## 2012-01-27 DIAGNOSIS — L988 Other specified disorders of the skin and subcutaneous tissue: Secondary | ICD-10-CM | POA: Diagnosis not present

## 2012-01-27 DIAGNOSIS — R413 Other amnesia: Secondary | ICD-10-CM | POA: Diagnosis not present

## 2012-01-27 DIAGNOSIS — K5900 Constipation, unspecified: Secondary | ICD-10-CM | POA: Diagnosis not present

## 2012-02-05 DIAGNOSIS — C44721 Squamous cell carcinoma of skin of unspecified lower limb, including hip: Secondary | ICD-10-CM | POA: Diagnosis not present

## 2012-02-11 DIAGNOSIS — Z1211 Encounter for screening for malignant neoplasm of colon: Secondary | ICD-10-CM | POA: Diagnosis not present

## 2012-07-28 DIAGNOSIS — R413 Other amnesia: Secondary | ICD-10-CM | POA: Diagnosis not present

## 2012-07-28 DIAGNOSIS — I10 Essential (primary) hypertension: Secondary | ICD-10-CM | POA: Diagnosis not present

## 2012-07-28 DIAGNOSIS — Z1331 Encounter for screening for depression: Secondary | ICD-10-CM | POA: Diagnosis not present

## 2012-07-28 DIAGNOSIS — E785 Hyperlipidemia, unspecified: Secondary | ICD-10-CM | POA: Diagnosis not present

## 2012-07-28 DIAGNOSIS — E559 Vitamin D deficiency, unspecified: Secondary | ICD-10-CM | POA: Diagnosis not present

## 2012-07-28 DIAGNOSIS — Z23 Encounter for immunization: Secondary | ICD-10-CM | POA: Diagnosis not present

## 2012-07-28 DIAGNOSIS — H6120 Impacted cerumen, unspecified ear: Secondary | ICD-10-CM | POA: Diagnosis not present

## 2012-07-28 DIAGNOSIS — Z0000 Encounter for general adult medical examination without abnormal findings: Secondary | ICD-10-CM | POA: Diagnosis not present

## 2012-07-28 DIAGNOSIS — M109 Gout, unspecified: Secondary | ICD-10-CM | POA: Diagnosis not present

## 2012-07-28 DIAGNOSIS — Z79899 Other long term (current) drug therapy: Secondary | ICD-10-CM | POA: Diagnosis not present

## 2012-07-28 DIAGNOSIS — K5900 Constipation, unspecified: Secondary | ICD-10-CM | POA: Diagnosis not present

## 2013-08-02 DIAGNOSIS — Z23 Encounter for immunization: Secondary | ICD-10-CM | POA: Diagnosis not present

## 2013-09-28 DIAGNOSIS — I10 Essential (primary) hypertension: Secondary | ICD-10-CM | POA: Diagnosis not present

## 2013-09-28 DIAGNOSIS — M109 Gout, unspecified: Secondary | ICD-10-CM | POA: Diagnosis not present

## 2013-09-28 DIAGNOSIS — E559 Vitamin D deficiency, unspecified: Secondary | ICD-10-CM | POA: Diagnosis not present

## 2013-09-28 DIAGNOSIS — E785 Hyperlipidemia, unspecified: Secondary | ICD-10-CM | POA: Diagnosis not present

## 2013-09-28 DIAGNOSIS — Z1331 Encounter for screening for depression: Secondary | ICD-10-CM | POA: Diagnosis not present

## 2013-09-28 DIAGNOSIS — Z0000 Encounter for general adult medical examination without abnormal findings: Secondary | ICD-10-CM | POA: Diagnosis not present

## 2013-09-28 DIAGNOSIS — K5900 Constipation, unspecified: Secondary | ICD-10-CM | POA: Diagnosis not present

## 2013-09-28 DIAGNOSIS — R413 Other amnesia: Secondary | ICD-10-CM | POA: Diagnosis not present

## 2013-10-04 DIAGNOSIS — H02139 Senile ectropion of unspecified eye, unspecified eyelid: Secondary | ICD-10-CM | POA: Diagnosis not present

## 2013-10-04 DIAGNOSIS — H40019 Open angle with borderline findings, low risk, unspecified eye: Secondary | ICD-10-CM | POA: Diagnosis not present

## 2013-10-04 DIAGNOSIS — H01009 Unspecified blepharitis unspecified eye, unspecified eyelid: Secondary | ICD-10-CM | POA: Diagnosis not present

## 2013-10-04 DIAGNOSIS — D3130 Benign neoplasm of unspecified choroid: Secondary | ICD-10-CM | POA: Diagnosis not present

## 2013-10-04 DIAGNOSIS — H02839 Dermatochalasis of unspecified eye, unspecified eyelid: Secondary | ICD-10-CM | POA: Diagnosis not present

## 2013-10-04 DIAGNOSIS — H353190 Nonexudative age-related macular degeneration, unspecified eye, stage unspecified: Secondary | ICD-10-CM | POA: Diagnosis not present

## 2014-01-12 DIAGNOSIS — E039 Hypothyroidism, unspecified: Secondary | ICD-10-CM | POA: Diagnosis not present

## 2014-07-21 DIAGNOSIS — Z23 Encounter for immunization: Secondary | ICD-10-CM | POA: Diagnosis not present

## 2014-10-03 DIAGNOSIS — K5900 Constipation, unspecified: Secondary | ICD-10-CM | POA: Diagnosis not present

## 2014-10-03 DIAGNOSIS — Z23 Encounter for immunization: Secondary | ICD-10-CM | POA: Diagnosis not present

## 2014-10-03 DIAGNOSIS — I10 Essential (primary) hypertension: Secondary | ICD-10-CM | POA: Diagnosis not present

## 2014-10-03 DIAGNOSIS — N183 Chronic kidney disease, stage 3 (moderate): Secondary | ICD-10-CM | POA: Diagnosis not present

## 2014-10-03 DIAGNOSIS — E039 Hypothyroidism, unspecified: Secondary | ICD-10-CM | POA: Diagnosis not present

## 2014-10-03 DIAGNOSIS — Z0001 Encounter for general adult medical examination with abnormal findings: Secondary | ICD-10-CM | POA: Diagnosis not present

## 2014-10-03 DIAGNOSIS — E559 Vitamin D deficiency, unspecified: Secondary | ICD-10-CM | POA: Diagnosis not present

## 2014-10-03 DIAGNOSIS — Z1389 Encounter for screening for other disorder: Secondary | ICD-10-CM | POA: Diagnosis not present

## 2014-10-03 DIAGNOSIS — M5430 Sciatica, unspecified side: Secondary | ICD-10-CM | POA: Diagnosis not present

## 2014-10-03 DIAGNOSIS — D485 Neoplasm of uncertain behavior of skin: Secondary | ICD-10-CM | POA: Diagnosis not present

## 2014-10-03 DIAGNOSIS — M1040 Other secondary gout, unspecified site: Secondary | ICD-10-CM | POA: Diagnosis not present

## 2014-10-03 DIAGNOSIS — E785 Hyperlipidemia, unspecified: Secondary | ICD-10-CM | POA: Diagnosis not present

## 2014-12-29 DIAGNOSIS — H40013 Open angle with borderline findings, low risk, bilateral: Secondary | ICD-10-CM | POA: Diagnosis not present

## 2014-12-29 DIAGNOSIS — Z961 Presence of intraocular lens: Secondary | ICD-10-CM | POA: Diagnosis not present

## 2014-12-29 DIAGNOSIS — H02833 Dermatochalasis of right eye, unspecified eyelid: Secondary | ICD-10-CM | POA: Diagnosis not present

## 2014-12-29 DIAGNOSIS — H01003 Unspecified blepharitis right eye, unspecified eyelid: Secondary | ICD-10-CM | POA: Diagnosis not present

## 2015-02-15 DIAGNOSIS — C44692 Other specified malignant neoplasm of skin of right upper limb, including shoulder: Secondary | ICD-10-CM | POA: Diagnosis not present

## 2015-02-15 DIAGNOSIS — L720 Epidermal cyst: Secondary | ICD-10-CM | POA: Diagnosis not present

## 2015-02-15 DIAGNOSIS — C44622 Squamous cell carcinoma of skin of right upper limb, including shoulder: Secondary | ICD-10-CM | POA: Diagnosis not present

## 2015-05-09 DIAGNOSIS — C44622 Squamous cell carcinoma of skin of right upper limb, including shoulder: Secondary | ICD-10-CM | POA: Diagnosis not present

## 2015-05-09 DIAGNOSIS — Z85828 Personal history of other malignant neoplasm of skin: Secondary | ICD-10-CM | POA: Diagnosis not present

## 2015-05-09 DIAGNOSIS — D485 Neoplasm of uncertain behavior of skin: Secondary | ICD-10-CM | POA: Diagnosis not present

## 2015-05-09 DIAGNOSIS — Z08 Encounter for follow-up examination after completed treatment for malignant neoplasm: Secondary | ICD-10-CM | POA: Diagnosis not present

## 2015-06-27 DIAGNOSIS — Z08 Encounter for follow-up examination after completed treatment for malignant neoplasm: Secondary | ICD-10-CM | POA: Diagnosis not present

## 2015-06-27 DIAGNOSIS — C44519 Basal cell carcinoma of skin of other part of trunk: Secondary | ICD-10-CM | POA: Diagnosis not present

## 2015-06-27 DIAGNOSIS — Z85828 Personal history of other malignant neoplasm of skin: Secondary | ICD-10-CM | POA: Diagnosis not present

## 2015-07-28 DIAGNOSIS — Z23 Encounter for immunization: Secondary | ICD-10-CM | POA: Diagnosis not present

## 2015-12-05 DIAGNOSIS — C4442 Squamous cell carcinoma of skin of scalp and neck: Secondary | ICD-10-CM | POA: Diagnosis not present

## 2015-12-05 DIAGNOSIS — C4449 Other specified malignant neoplasm of skin of scalp and neck: Secondary | ICD-10-CM | POA: Diagnosis not present

## 2015-12-05 DIAGNOSIS — C44319 Basal cell carcinoma of skin of other parts of face: Secondary | ICD-10-CM | POA: Diagnosis not present

## 2016-01-01 DIAGNOSIS — E785 Hyperlipidemia, unspecified: Secondary | ICD-10-CM | POA: Diagnosis not present

## 2016-01-01 DIAGNOSIS — E039 Hypothyroidism, unspecified: Secondary | ICD-10-CM | POA: Diagnosis not present

## 2016-01-01 DIAGNOSIS — Z79899 Other long term (current) drug therapy: Secondary | ICD-10-CM | POA: Diagnosis not present

## 2016-01-01 DIAGNOSIS — I10 Essential (primary) hypertension: Secondary | ICD-10-CM | POA: Diagnosis not present

## 2016-01-01 DIAGNOSIS — K5900 Constipation, unspecified: Secondary | ICD-10-CM | POA: Diagnosis not present

## 2016-01-01 DIAGNOSIS — Z0001 Encounter for general adult medical examination with abnormal findings: Secondary | ICD-10-CM | POA: Diagnosis not present

## 2016-01-01 DIAGNOSIS — M1040 Other secondary gout, unspecified site: Secondary | ICD-10-CM | POA: Diagnosis not present

## 2016-01-01 DIAGNOSIS — M5430 Sciatica, unspecified side: Secondary | ICD-10-CM | POA: Diagnosis not present

## 2016-01-01 DIAGNOSIS — D485 Neoplasm of uncertain behavior of skin: Secondary | ICD-10-CM | POA: Diagnosis not present

## 2016-01-01 DIAGNOSIS — E559 Vitamin D deficiency, unspecified: Secondary | ICD-10-CM | POA: Diagnosis not present

## 2016-01-01 DIAGNOSIS — N183 Chronic kidney disease, stage 3 (moderate): Secondary | ICD-10-CM | POA: Diagnosis not present

## 2016-01-01 DIAGNOSIS — Z1389 Encounter for screening for other disorder: Secondary | ICD-10-CM | POA: Diagnosis not present

## 2016-02-13 DIAGNOSIS — H353110 Nonexudative age-related macular degeneration, right eye, stage unspecified: Secondary | ICD-10-CM | POA: Diagnosis not present

## 2016-02-13 DIAGNOSIS — H353120 Nonexudative age-related macular degeneration, left eye, stage unspecified: Secondary | ICD-10-CM | POA: Diagnosis not present

## 2016-02-13 DIAGNOSIS — Z961 Presence of intraocular lens: Secondary | ICD-10-CM | POA: Diagnosis not present

## 2016-02-13 DIAGNOSIS — H40013 Open angle with borderline findings, low risk, bilateral: Secondary | ICD-10-CM | POA: Diagnosis not present

## 2016-04-30 DIAGNOSIS — I10 Essential (primary) hypertension: Secondary | ICD-10-CM | POA: Diagnosis not present

## 2017-01-16 DIAGNOSIS — E559 Vitamin D deficiency, unspecified: Secondary | ICD-10-CM | POA: Diagnosis not present

## 2017-01-16 DIAGNOSIS — Z0001 Encounter for general adult medical examination with abnormal findings: Secondary | ICD-10-CM | POA: Diagnosis not present

## 2017-01-16 DIAGNOSIS — E039 Hypothyroidism, unspecified: Secondary | ICD-10-CM | POA: Diagnosis not present

## 2017-01-16 DIAGNOSIS — Z1389 Encounter for screening for other disorder: Secondary | ICD-10-CM | POA: Diagnosis not present

## 2017-01-16 DIAGNOSIS — Z79899 Other long term (current) drug therapy: Secondary | ICD-10-CM | POA: Diagnosis not present

## 2017-01-16 DIAGNOSIS — E785 Hyperlipidemia, unspecified: Secondary | ICD-10-CM | POA: Diagnosis not present

## 2017-01-16 DIAGNOSIS — C44319 Basal cell carcinoma of skin of other parts of face: Secondary | ICD-10-CM | POA: Diagnosis not present

## 2017-01-16 DIAGNOSIS — M1040 Other secondary gout, unspecified site: Secondary | ICD-10-CM | POA: Diagnosis not present

## 2017-01-16 DIAGNOSIS — I10 Essential (primary) hypertension: Secondary | ICD-10-CM | POA: Diagnosis not present

## 2017-01-16 DIAGNOSIS — L570 Actinic keratosis: Secondary | ICD-10-CM | POA: Diagnosis not present

## 2017-01-16 DIAGNOSIS — K5900 Constipation, unspecified: Secondary | ICD-10-CM | POA: Diagnosis not present

## 2017-01-16 DIAGNOSIS — N183 Chronic kidney disease, stage 3 (moderate): Secondary | ICD-10-CM | POA: Diagnosis not present

## 2017-01-16 DIAGNOSIS — L858 Other specified epidermal thickening: Secondary | ICD-10-CM | POA: Diagnosis not present

## 2017-01-16 DIAGNOSIS — D485 Neoplasm of uncertain behavior of skin: Secondary | ICD-10-CM | POA: Diagnosis not present

## 2017-01-29 DIAGNOSIS — I10 Essential (primary) hypertension: Secondary | ICD-10-CM | POA: Diagnosis not present

## 2017-02-13 DIAGNOSIS — Z961 Presence of intraocular lens: Secondary | ICD-10-CM | POA: Diagnosis not present

## 2017-02-13 DIAGNOSIS — H40013 Open angle with borderline findings, low risk, bilateral: Secondary | ICD-10-CM | POA: Diagnosis not present

## 2017-02-13 DIAGNOSIS — H35033 Hypertensive retinopathy, bilateral: Secondary | ICD-10-CM | POA: Diagnosis not present

## 2017-02-13 DIAGNOSIS — H353130 Nonexudative age-related macular degeneration, bilateral, stage unspecified: Secondary | ICD-10-CM | POA: Diagnosis not present

## 2017-02-17 DIAGNOSIS — N390 Urinary tract infection, site not specified: Secondary | ICD-10-CM | POA: Diagnosis not present

## 2017-02-18 DIAGNOSIS — C44311 Basal cell carcinoma of skin of nose: Secondary | ICD-10-CM | POA: Diagnosis not present

## 2017-04-29 DIAGNOSIS — C4442 Squamous cell carcinoma of skin of scalp and neck: Secondary | ICD-10-CM | POA: Diagnosis not present

## 2017-08-01 DIAGNOSIS — Z23 Encounter for immunization: Secondary | ICD-10-CM | POA: Diagnosis not present

## 2017-11-12 DIAGNOSIS — M79675 Pain in left toe(s): Secondary | ICD-10-CM | POA: Diagnosis not present

## 2017-11-12 DIAGNOSIS — L97511 Non-pressure chronic ulcer of other part of right foot limited to breakdown of skin: Secondary | ICD-10-CM | POA: Diagnosis not present

## 2017-11-12 DIAGNOSIS — L84 Corns and callosities: Secondary | ICD-10-CM | POA: Diagnosis not present

## 2017-11-12 DIAGNOSIS — I739 Peripheral vascular disease, unspecified: Secondary | ICD-10-CM | POA: Diagnosis not present

## 2017-11-12 DIAGNOSIS — M2041 Other hammer toe(s) (acquired), right foot: Secondary | ICD-10-CM | POA: Diagnosis not present

## 2017-11-12 DIAGNOSIS — L97521 Non-pressure chronic ulcer of other part of left foot limited to breakdown of skin: Secondary | ICD-10-CM | POA: Diagnosis not present

## 2017-11-12 DIAGNOSIS — B351 Tinea unguium: Secondary | ICD-10-CM | POA: Diagnosis not present

## 2017-11-12 DIAGNOSIS — M2042 Other hammer toe(s) (acquired), left foot: Secondary | ICD-10-CM | POA: Diagnosis not present

## 2017-11-15 DIAGNOSIS — S80211D Abrasion, right knee, subsequent encounter: Secondary | ICD-10-CM | POA: Diagnosis not present

## 2017-11-15 DIAGNOSIS — S80811D Abrasion, right lower leg, subsequent encounter: Secondary | ICD-10-CM | POA: Diagnosis not present

## 2017-11-15 DIAGNOSIS — M2042 Other hammer toe(s) (acquired), left foot: Secondary | ICD-10-CM | POA: Diagnosis not present

## 2017-11-15 DIAGNOSIS — S90414D Abrasion, right lesser toe(s), subsequent encounter: Secondary | ICD-10-CM | POA: Diagnosis not present

## 2017-11-15 DIAGNOSIS — M2041 Other hammer toe(s) (acquired), right foot: Secondary | ICD-10-CM | POA: Diagnosis not present

## 2017-11-15 DIAGNOSIS — S90412D Abrasion, left great toe, subsequent encounter: Secondary | ICD-10-CM | POA: Diagnosis not present

## 2017-11-15 DIAGNOSIS — Z9181 History of falling: Secondary | ICD-10-CM | POA: Diagnosis not present

## 2017-11-15 DIAGNOSIS — I10 Essential (primary) hypertension: Secondary | ICD-10-CM | POA: Diagnosis not present

## 2017-11-15 DIAGNOSIS — F0280 Dementia in other diseases classified elsewhere without behavioral disturbance: Secondary | ICD-10-CM | POA: Diagnosis not present

## 2017-11-15 DIAGNOSIS — I8393 Asymptomatic varicose veins of bilateral lower extremities: Secondary | ICD-10-CM | POA: Diagnosis not present

## 2017-11-15 DIAGNOSIS — L84 Corns and callosities: Secondary | ICD-10-CM | POA: Diagnosis not present

## 2017-11-15 DIAGNOSIS — G309 Alzheimer's disease, unspecified: Secondary | ICD-10-CM | POA: Diagnosis not present

## 2017-11-15 DIAGNOSIS — I739 Peripheral vascular disease, unspecified: Secondary | ICD-10-CM | POA: Diagnosis not present

## 2017-11-16 DIAGNOSIS — G309 Alzheimer's disease, unspecified: Secondary | ICD-10-CM | POA: Diagnosis not present

## 2017-11-16 DIAGNOSIS — S90414D Abrasion, right lesser toe(s), subsequent encounter: Secondary | ICD-10-CM | POA: Diagnosis not present

## 2017-11-16 DIAGNOSIS — S80811D Abrasion, right lower leg, subsequent encounter: Secondary | ICD-10-CM | POA: Diagnosis not present

## 2017-11-16 DIAGNOSIS — S90412D Abrasion, left great toe, subsequent encounter: Secondary | ICD-10-CM | POA: Diagnosis not present

## 2017-11-16 DIAGNOSIS — F0280 Dementia in other diseases classified elsewhere without behavioral disturbance: Secondary | ICD-10-CM | POA: Diagnosis not present

## 2017-11-16 DIAGNOSIS — S80211D Abrasion, right knee, subsequent encounter: Secondary | ICD-10-CM | POA: Diagnosis not present

## 2017-11-17 DIAGNOSIS — F0280 Dementia in other diseases classified elsewhere without behavioral disturbance: Secondary | ICD-10-CM | POA: Diagnosis not present

## 2017-11-17 DIAGNOSIS — S80211D Abrasion, right knee, subsequent encounter: Secondary | ICD-10-CM | POA: Diagnosis not present

## 2017-11-17 DIAGNOSIS — S80811D Abrasion, right lower leg, subsequent encounter: Secondary | ICD-10-CM | POA: Diagnosis not present

## 2017-11-17 DIAGNOSIS — S90414D Abrasion, right lesser toe(s), subsequent encounter: Secondary | ICD-10-CM | POA: Diagnosis not present

## 2017-11-17 DIAGNOSIS — G309 Alzheimer's disease, unspecified: Secondary | ICD-10-CM | POA: Diagnosis not present

## 2017-11-17 DIAGNOSIS — S90412D Abrasion, left great toe, subsequent encounter: Secondary | ICD-10-CM | POA: Diagnosis not present

## 2017-11-20 DIAGNOSIS — S80811D Abrasion, right lower leg, subsequent encounter: Secondary | ICD-10-CM | POA: Diagnosis not present

## 2017-11-20 DIAGNOSIS — S90414D Abrasion, right lesser toe(s), subsequent encounter: Secondary | ICD-10-CM | POA: Diagnosis not present

## 2017-11-20 DIAGNOSIS — F0280 Dementia in other diseases classified elsewhere without behavioral disturbance: Secondary | ICD-10-CM | POA: Diagnosis not present

## 2017-11-20 DIAGNOSIS — S90412D Abrasion, left great toe, subsequent encounter: Secondary | ICD-10-CM | POA: Diagnosis not present

## 2017-11-20 DIAGNOSIS — G309 Alzheimer's disease, unspecified: Secondary | ICD-10-CM | POA: Diagnosis not present

## 2017-11-20 DIAGNOSIS — S80211D Abrasion, right knee, subsequent encounter: Secondary | ICD-10-CM | POA: Diagnosis not present

## 2017-11-21 DIAGNOSIS — F0280 Dementia in other diseases classified elsewhere without behavioral disturbance: Secondary | ICD-10-CM | POA: Diagnosis not present

## 2017-11-21 DIAGNOSIS — S90414D Abrasion, right lesser toe(s), subsequent encounter: Secondary | ICD-10-CM | POA: Diagnosis not present

## 2017-11-21 DIAGNOSIS — S80811D Abrasion, right lower leg, subsequent encounter: Secondary | ICD-10-CM | POA: Diagnosis not present

## 2017-11-21 DIAGNOSIS — S80211D Abrasion, right knee, subsequent encounter: Secondary | ICD-10-CM | POA: Diagnosis not present

## 2017-11-21 DIAGNOSIS — G309 Alzheimer's disease, unspecified: Secondary | ICD-10-CM | POA: Diagnosis not present

## 2017-11-21 DIAGNOSIS — S90412D Abrasion, left great toe, subsequent encounter: Secondary | ICD-10-CM | POA: Diagnosis not present

## 2017-11-25 DIAGNOSIS — S90412D Abrasion, left great toe, subsequent encounter: Secondary | ICD-10-CM | POA: Diagnosis not present

## 2017-11-25 DIAGNOSIS — S90414D Abrasion, right lesser toe(s), subsequent encounter: Secondary | ICD-10-CM | POA: Diagnosis not present

## 2017-11-25 DIAGNOSIS — S80811D Abrasion, right lower leg, subsequent encounter: Secondary | ICD-10-CM | POA: Diagnosis not present

## 2017-11-25 DIAGNOSIS — S80211D Abrasion, right knee, subsequent encounter: Secondary | ICD-10-CM | POA: Diagnosis not present

## 2017-11-25 DIAGNOSIS — F0280 Dementia in other diseases classified elsewhere without behavioral disturbance: Secondary | ICD-10-CM | POA: Diagnosis not present

## 2017-11-25 DIAGNOSIS — G309 Alzheimer's disease, unspecified: Secondary | ICD-10-CM | POA: Diagnosis not present

## 2017-11-27 DIAGNOSIS — S80811D Abrasion, right lower leg, subsequent encounter: Secondary | ICD-10-CM | POA: Diagnosis not present

## 2017-11-27 DIAGNOSIS — G309 Alzheimer's disease, unspecified: Secondary | ICD-10-CM | POA: Diagnosis not present

## 2017-11-27 DIAGNOSIS — S90414D Abrasion, right lesser toe(s), subsequent encounter: Secondary | ICD-10-CM | POA: Diagnosis not present

## 2017-11-27 DIAGNOSIS — F0280 Dementia in other diseases classified elsewhere without behavioral disturbance: Secondary | ICD-10-CM | POA: Diagnosis not present

## 2017-11-27 DIAGNOSIS — S90412D Abrasion, left great toe, subsequent encounter: Secondary | ICD-10-CM | POA: Diagnosis not present

## 2017-11-27 DIAGNOSIS — S80211D Abrasion, right knee, subsequent encounter: Secondary | ICD-10-CM | POA: Diagnosis not present

## 2017-11-28 DIAGNOSIS — S90414D Abrasion, right lesser toe(s), subsequent encounter: Secondary | ICD-10-CM | POA: Diagnosis not present

## 2017-11-28 DIAGNOSIS — S80211D Abrasion, right knee, subsequent encounter: Secondary | ICD-10-CM | POA: Diagnosis not present

## 2017-11-28 DIAGNOSIS — S80811D Abrasion, right lower leg, subsequent encounter: Secondary | ICD-10-CM | POA: Diagnosis not present

## 2017-11-28 DIAGNOSIS — S90412D Abrasion, left great toe, subsequent encounter: Secondary | ICD-10-CM | POA: Diagnosis not present

## 2017-11-28 DIAGNOSIS — F0280 Dementia in other diseases classified elsewhere without behavioral disturbance: Secondary | ICD-10-CM | POA: Diagnosis not present

## 2017-11-28 DIAGNOSIS — G309 Alzheimer's disease, unspecified: Secondary | ICD-10-CM | POA: Diagnosis not present

## 2017-12-02 DIAGNOSIS — S80211D Abrasion, right knee, subsequent encounter: Secondary | ICD-10-CM | POA: Diagnosis not present

## 2017-12-02 DIAGNOSIS — S80811D Abrasion, right lower leg, subsequent encounter: Secondary | ICD-10-CM | POA: Diagnosis not present

## 2017-12-02 DIAGNOSIS — S90414D Abrasion, right lesser toe(s), subsequent encounter: Secondary | ICD-10-CM | POA: Diagnosis not present

## 2017-12-02 DIAGNOSIS — S90412D Abrasion, left great toe, subsequent encounter: Secondary | ICD-10-CM | POA: Diagnosis not present

## 2017-12-02 DIAGNOSIS — F0280 Dementia in other diseases classified elsewhere without behavioral disturbance: Secondary | ICD-10-CM | POA: Diagnosis not present

## 2017-12-02 DIAGNOSIS — G309 Alzheimer's disease, unspecified: Secondary | ICD-10-CM | POA: Diagnosis not present

## 2017-12-05 DIAGNOSIS — S80211D Abrasion, right knee, subsequent encounter: Secondary | ICD-10-CM | POA: Diagnosis not present

## 2017-12-05 DIAGNOSIS — S80811D Abrasion, right lower leg, subsequent encounter: Secondary | ICD-10-CM | POA: Diagnosis not present

## 2017-12-05 DIAGNOSIS — G309 Alzheimer's disease, unspecified: Secondary | ICD-10-CM | POA: Diagnosis not present

## 2017-12-05 DIAGNOSIS — S90412D Abrasion, left great toe, subsequent encounter: Secondary | ICD-10-CM | POA: Diagnosis not present

## 2017-12-05 DIAGNOSIS — S90414D Abrasion, right lesser toe(s), subsequent encounter: Secondary | ICD-10-CM | POA: Diagnosis not present

## 2017-12-05 DIAGNOSIS — F0280 Dementia in other diseases classified elsewhere without behavioral disturbance: Secondary | ICD-10-CM | POA: Diagnosis not present

## 2017-12-09 DIAGNOSIS — G309 Alzheimer's disease, unspecified: Secondary | ICD-10-CM | POA: Diagnosis not present

## 2017-12-09 DIAGNOSIS — F0280 Dementia in other diseases classified elsewhere without behavioral disturbance: Secondary | ICD-10-CM | POA: Diagnosis not present

## 2017-12-09 DIAGNOSIS — S80811D Abrasion, right lower leg, subsequent encounter: Secondary | ICD-10-CM | POA: Diagnosis not present

## 2017-12-09 DIAGNOSIS — S90414D Abrasion, right lesser toe(s), subsequent encounter: Secondary | ICD-10-CM | POA: Diagnosis not present

## 2017-12-09 DIAGNOSIS — S80211D Abrasion, right knee, subsequent encounter: Secondary | ICD-10-CM | POA: Diagnosis not present

## 2017-12-09 DIAGNOSIS — S90412D Abrasion, left great toe, subsequent encounter: Secondary | ICD-10-CM | POA: Diagnosis not present

## 2017-12-16 DIAGNOSIS — F0280 Dementia in other diseases classified elsewhere without behavioral disturbance: Secondary | ICD-10-CM | POA: Diagnosis not present

## 2017-12-16 DIAGNOSIS — S90412D Abrasion, left great toe, subsequent encounter: Secondary | ICD-10-CM | POA: Diagnosis not present

## 2017-12-16 DIAGNOSIS — G309 Alzheimer's disease, unspecified: Secondary | ICD-10-CM | POA: Diagnosis not present

## 2017-12-16 DIAGNOSIS — S80811D Abrasion, right lower leg, subsequent encounter: Secondary | ICD-10-CM | POA: Diagnosis not present

## 2017-12-16 DIAGNOSIS — S80211D Abrasion, right knee, subsequent encounter: Secondary | ICD-10-CM | POA: Diagnosis not present

## 2017-12-16 DIAGNOSIS — S90414D Abrasion, right lesser toe(s), subsequent encounter: Secondary | ICD-10-CM | POA: Diagnosis not present

## 2017-12-29 DIAGNOSIS — G309 Alzheimer's disease, unspecified: Secondary | ICD-10-CM | POA: Diagnosis not present

## 2017-12-29 DIAGNOSIS — L97511 Non-pressure chronic ulcer of other part of right foot limited to breakdown of skin: Secondary | ICD-10-CM | POA: Diagnosis not present

## 2017-12-29 DIAGNOSIS — F0280 Dementia in other diseases classified elsewhere without behavioral disturbance: Secondary | ICD-10-CM | POA: Diagnosis not present

## 2017-12-29 DIAGNOSIS — S90412D Abrasion, left great toe, subsequent encounter: Secondary | ICD-10-CM | POA: Diagnosis not present

## 2017-12-29 DIAGNOSIS — S80811D Abrasion, right lower leg, subsequent encounter: Secondary | ICD-10-CM | POA: Diagnosis not present

## 2017-12-29 DIAGNOSIS — S80211D Abrasion, right knee, subsequent encounter: Secondary | ICD-10-CM | POA: Diagnosis not present

## 2017-12-29 DIAGNOSIS — L97519 Non-pressure chronic ulcer of other part of right foot with unspecified severity: Secondary | ICD-10-CM | POA: Diagnosis not present

## 2017-12-29 DIAGNOSIS — S90414D Abrasion, right lesser toe(s), subsequent encounter: Secondary | ICD-10-CM | POA: Diagnosis not present

## 2018-01-01 DIAGNOSIS — S90412D Abrasion, left great toe, subsequent encounter: Secondary | ICD-10-CM | POA: Diagnosis not present

## 2018-01-01 DIAGNOSIS — S90414D Abrasion, right lesser toe(s), subsequent encounter: Secondary | ICD-10-CM | POA: Diagnosis not present

## 2018-01-01 DIAGNOSIS — G309 Alzheimer's disease, unspecified: Secondary | ICD-10-CM | POA: Diagnosis not present

## 2018-01-01 DIAGNOSIS — F0280 Dementia in other diseases classified elsewhere without behavioral disturbance: Secondary | ICD-10-CM | POA: Diagnosis not present

## 2018-01-01 DIAGNOSIS — S80211D Abrasion, right knee, subsequent encounter: Secondary | ICD-10-CM | POA: Diagnosis not present

## 2018-01-01 DIAGNOSIS — S80811D Abrasion, right lower leg, subsequent encounter: Secondary | ICD-10-CM | POA: Diagnosis not present

## 2018-01-08 DIAGNOSIS — S80211D Abrasion, right knee, subsequent encounter: Secondary | ICD-10-CM | POA: Diagnosis not present

## 2018-01-08 DIAGNOSIS — G309 Alzheimer's disease, unspecified: Secondary | ICD-10-CM | POA: Diagnosis not present

## 2018-01-08 DIAGNOSIS — F0280 Dementia in other diseases classified elsewhere without behavioral disturbance: Secondary | ICD-10-CM | POA: Diagnosis not present

## 2018-01-08 DIAGNOSIS — S80811D Abrasion, right lower leg, subsequent encounter: Secondary | ICD-10-CM | POA: Diagnosis not present

## 2018-01-08 DIAGNOSIS — S90414D Abrasion, right lesser toe(s), subsequent encounter: Secondary | ICD-10-CM | POA: Diagnosis not present

## 2018-01-08 DIAGNOSIS — S90412D Abrasion, left great toe, subsequent encounter: Secondary | ICD-10-CM | POA: Diagnosis not present

## 2018-01-09 DIAGNOSIS — S90414D Abrasion, right lesser toe(s), subsequent encounter: Secondary | ICD-10-CM | POA: Diagnosis not present

## 2018-01-09 DIAGNOSIS — S80211D Abrasion, right knee, subsequent encounter: Secondary | ICD-10-CM | POA: Diagnosis not present

## 2018-01-09 DIAGNOSIS — S90412D Abrasion, left great toe, subsequent encounter: Secondary | ICD-10-CM | POA: Diagnosis not present

## 2018-01-09 DIAGNOSIS — F0280 Dementia in other diseases classified elsewhere without behavioral disturbance: Secondary | ICD-10-CM | POA: Diagnosis not present

## 2018-01-09 DIAGNOSIS — G309 Alzheimer's disease, unspecified: Secondary | ICD-10-CM | POA: Diagnosis not present

## 2018-01-09 DIAGNOSIS — S80811D Abrasion, right lower leg, subsequent encounter: Secondary | ICD-10-CM | POA: Diagnosis not present

## 2018-01-14 DIAGNOSIS — L84 Corns and callosities: Secondary | ICD-10-CM | POA: Diagnosis not present

## 2018-01-14 DIAGNOSIS — Z9181 History of falling: Secondary | ICD-10-CM | POA: Diagnosis not present

## 2018-01-14 DIAGNOSIS — S90414D Abrasion, right lesser toe(s), subsequent encounter: Secondary | ICD-10-CM | POA: Diagnosis not present

## 2018-01-14 DIAGNOSIS — I8393 Asymptomatic varicose veins of bilateral lower extremities: Secondary | ICD-10-CM | POA: Diagnosis not present

## 2018-01-14 DIAGNOSIS — I10 Essential (primary) hypertension: Secondary | ICD-10-CM | POA: Diagnosis not present

## 2018-01-14 DIAGNOSIS — G309 Alzheimer's disease, unspecified: Secondary | ICD-10-CM | POA: Diagnosis not present

## 2018-01-14 DIAGNOSIS — F0280 Dementia in other diseases classified elsewhere without behavioral disturbance: Secondary | ICD-10-CM | POA: Diagnosis not present

## 2018-01-14 DIAGNOSIS — I739 Peripheral vascular disease, unspecified: Secondary | ICD-10-CM | POA: Diagnosis not present

## 2018-01-14 DIAGNOSIS — M2042 Other hammer toe(s) (acquired), left foot: Secondary | ICD-10-CM | POA: Diagnosis not present

## 2018-01-14 DIAGNOSIS — M2041 Other hammer toe(s) (acquired), right foot: Secondary | ICD-10-CM | POA: Diagnosis not present

## 2018-01-15 DIAGNOSIS — I10 Essential (primary) hypertension: Secondary | ICD-10-CM | POA: Diagnosis not present

## 2018-01-15 DIAGNOSIS — I8393 Asymptomatic varicose veins of bilateral lower extremities: Secondary | ICD-10-CM | POA: Diagnosis not present

## 2018-01-15 DIAGNOSIS — I739 Peripheral vascular disease, unspecified: Secondary | ICD-10-CM | POA: Diagnosis not present

## 2018-01-15 DIAGNOSIS — F0280 Dementia in other diseases classified elsewhere without behavioral disturbance: Secondary | ICD-10-CM | POA: Diagnosis not present

## 2018-01-15 DIAGNOSIS — G309 Alzheimer's disease, unspecified: Secondary | ICD-10-CM | POA: Diagnosis not present

## 2018-01-15 DIAGNOSIS — S90414D Abrasion, right lesser toe(s), subsequent encounter: Secondary | ICD-10-CM | POA: Diagnosis not present

## 2018-01-23 DIAGNOSIS — G309 Alzheimer's disease, unspecified: Secondary | ICD-10-CM | POA: Diagnosis not present

## 2018-01-23 DIAGNOSIS — S90414D Abrasion, right lesser toe(s), subsequent encounter: Secondary | ICD-10-CM | POA: Diagnosis not present

## 2018-01-23 DIAGNOSIS — I8393 Asymptomatic varicose veins of bilateral lower extremities: Secondary | ICD-10-CM | POA: Diagnosis not present

## 2018-01-23 DIAGNOSIS — F0280 Dementia in other diseases classified elsewhere without behavioral disturbance: Secondary | ICD-10-CM | POA: Diagnosis not present

## 2018-01-23 DIAGNOSIS — I10 Essential (primary) hypertension: Secondary | ICD-10-CM | POA: Diagnosis not present

## 2018-01-23 DIAGNOSIS — I739 Peripheral vascular disease, unspecified: Secondary | ICD-10-CM | POA: Diagnosis not present

## 2018-01-27 DIAGNOSIS — B351 Tinea unguium: Secondary | ICD-10-CM | POA: Diagnosis not present

## 2018-01-27 DIAGNOSIS — L84 Corns and callosities: Secondary | ICD-10-CM | POA: Diagnosis not present

## 2018-01-27 DIAGNOSIS — M2042 Other hammer toe(s) (acquired), left foot: Secondary | ICD-10-CM | POA: Diagnosis not present

## 2018-01-27 DIAGNOSIS — I739 Peripheral vascular disease, unspecified: Secondary | ICD-10-CM | POA: Diagnosis not present

## 2018-01-27 DIAGNOSIS — M79675 Pain in left toe(s): Secondary | ICD-10-CM | POA: Diagnosis not present

## 2018-01-27 DIAGNOSIS — L97511 Non-pressure chronic ulcer of other part of right foot limited to breakdown of skin: Secondary | ICD-10-CM | POA: Diagnosis not present

## 2018-01-27 DIAGNOSIS — M2041 Other hammer toe(s) (acquired), right foot: Secondary | ICD-10-CM | POA: Diagnosis not present

## 2018-01-28 DIAGNOSIS — Z0000 Encounter for general adult medical examination without abnormal findings: Secondary | ICD-10-CM | POA: Diagnosis not present

## 2018-01-28 DIAGNOSIS — M1049 Other secondary gout, multiple sites: Secondary | ICD-10-CM | POA: Diagnosis not present

## 2018-01-28 DIAGNOSIS — E559 Vitamin D deficiency, unspecified: Secondary | ICD-10-CM | POA: Diagnosis not present

## 2018-01-28 DIAGNOSIS — G309 Alzheimer's disease, unspecified: Secondary | ICD-10-CM | POA: Diagnosis not present

## 2018-01-28 DIAGNOSIS — S90414D Abrasion, right lesser toe(s), subsequent encounter: Secondary | ICD-10-CM | POA: Diagnosis not present

## 2018-01-28 DIAGNOSIS — H6121 Impacted cerumen, right ear: Secondary | ICD-10-CM | POA: Diagnosis not present

## 2018-01-28 DIAGNOSIS — Z79899 Other long term (current) drug therapy: Secondary | ICD-10-CM | POA: Diagnosis not present

## 2018-01-28 DIAGNOSIS — F0280 Dementia in other diseases classified elsewhere without behavioral disturbance: Secondary | ICD-10-CM | POA: Diagnosis not present

## 2018-01-28 DIAGNOSIS — Z1389 Encounter for screening for other disorder: Secondary | ICD-10-CM | POA: Diagnosis not present

## 2018-01-28 DIAGNOSIS — E785 Hyperlipidemia, unspecified: Secondary | ICD-10-CM | POA: Diagnosis not present

## 2018-01-28 DIAGNOSIS — I8393 Asymptomatic varicose veins of bilateral lower extremities: Secondary | ICD-10-CM | POA: Diagnosis not present

## 2018-01-28 DIAGNOSIS — I739 Peripheral vascular disease, unspecified: Secondary | ICD-10-CM | POA: Diagnosis not present

## 2018-01-28 DIAGNOSIS — I10 Essential (primary) hypertension: Secondary | ICD-10-CM | POA: Diagnosis not present

## 2018-01-28 DIAGNOSIS — E039 Hypothyroidism, unspecified: Secondary | ICD-10-CM | POA: Diagnosis not present

## 2018-02-19 DIAGNOSIS — H353131 Nonexudative age-related macular degeneration, bilateral, early dry stage: Secondary | ICD-10-CM | POA: Diagnosis not present

## 2018-02-19 DIAGNOSIS — Z961 Presence of intraocular lens: Secondary | ICD-10-CM | POA: Diagnosis not present

## 2018-02-19 DIAGNOSIS — H40013 Open angle with borderline findings, low risk, bilateral: Secondary | ICD-10-CM | POA: Diagnosis not present

## 2018-02-19 DIAGNOSIS — H35033 Hypertensive retinopathy, bilateral: Secondary | ICD-10-CM | POA: Diagnosis not present

## 2018-06-23 DIAGNOSIS — L97511 Non-pressure chronic ulcer of other part of right foot limited to breakdown of skin: Secondary | ICD-10-CM | POA: Diagnosis not present

## 2018-06-23 DIAGNOSIS — L84 Corns and callosities: Secondary | ICD-10-CM | POA: Diagnosis not present

## 2018-06-23 DIAGNOSIS — B351 Tinea unguium: Secondary | ICD-10-CM | POA: Diagnosis not present

## 2018-06-23 DIAGNOSIS — I739 Peripheral vascular disease, unspecified: Secondary | ICD-10-CM | POA: Diagnosis not present

## 2018-06-23 DIAGNOSIS — M79675 Pain in left toe(s): Secondary | ICD-10-CM | POA: Diagnosis not present

## 2018-06-23 DIAGNOSIS — M2041 Other hammer toe(s) (acquired), right foot: Secondary | ICD-10-CM | POA: Diagnosis not present

## 2018-08-18 DIAGNOSIS — Z23 Encounter for immunization: Secondary | ICD-10-CM | POA: Diagnosis not present

## 2018-09-27 ENCOUNTER — Encounter (HOSPITAL_COMMUNITY): Payer: Self-pay | Admitting: Internal Medicine

## 2018-09-27 ENCOUNTER — Other Ambulatory Visit: Payer: Self-pay

## 2018-09-27 ENCOUNTER — Emergency Department (HOSPITAL_COMMUNITY): Payer: Medicare Other

## 2018-09-27 ENCOUNTER — Inpatient Hospital Stay (HOSPITAL_COMMUNITY)
Admission: EM | Admit: 2018-09-27 | Discharge: 2018-10-06 | DRG: 194 | Disposition: A | Discharge status: Skilled Nursing Facility | Payer: Medicare Other | Attending: Internal Medicine | Admitting: Internal Medicine

## 2018-09-27 DIAGNOSIS — E872 Acidosis: Secondary | ICD-10-CM | POA: Diagnosis present

## 2018-09-27 DIAGNOSIS — N179 Acute kidney failure, unspecified: Secondary | ICD-10-CM | POA: Diagnosis present

## 2018-09-27 DIAGNOSIS — Z79890 Hormone replacement therapy (postmenopausal): Secondary | ICD-10-CM

## 2018-09-27 DIAGNOSIS — Z79899 Other long term (current) drug therapy: Secondary | ICD-10-CM | POA: Diagnosis not present

## 2018-09-27 DIAGNOSIS — R41841 Cognitive communication deficit: Secondary | ICD-10-CM | POA: Diagnosis not present

## 2018-09-27 DIAGNOSIS — W19XXXA Unspecified fall, initial encounter: Secondary | ICD-10-CM | POA: Diagnosis present

## 2018-09-27 DIAGNOSIS — I1310 Hypertensive heart and chronic kidney disease without heart failure, with stage 1 through stage 4 chronic kidney disease, or unspecified chronic kidney disease: Secondary | ICD-10-CM | POA: Diagnosis present

## 2018-09-27 DIAGNOSIS — R918 Other nonspecific abnormal finding of lung field: Secondary | ICD-10-CM | POA: Diagnosis not present

## 2018-09-27 DIAGNOSIS — F0390 Unspecified dementia without behavioral disturbance: Secondary | ICD-10-CM | POA: Diagnosis present

## 2018-09-27 DIAGNOSIS — M6281 Muscle weakness (generalized): Secondary | ICD-10-CM | POA: Diagnosis not present

## 2018-09-27 DIAGNOSIS — N189 Chronic kidney disease, unspecified: Secondary | ICD-10-CM | POA: Diagnosis present

## 2018-09-27 DIAGNOSIS — Z7401 Bed confinement status: Secondary | ICD-10-CM | POA: Diagnosis not present

## 2018-09-27 DIAGNOSIS — J189 Pneumonia, unspecified organism: Secondary | ICD-10-CM | POA: Diagnosis not present

## 2018-09-27 DIAGNOSIS — E785 Hyperlipidemia, unspecified: Secondary | ICD-10-CM | POA: Diagnosis present

## 2018-09-27 DIAGNOSIS — M109 Gout, unspecified: Secondary | ICD-10-CM | POA: Diagnosis present

## 2018-09-27 DIAGNOSIS — R300 Dysuria: Secondary | ICD-10-CM | POA: Diagnosis present

## 2018-09-27 DIAGNOSIS — D72829 Elevated white blood cell count, unspecified: Secondary | ICD-10-CM | POA: Diagnosis not present

## 2018-09-27 DIAGNOSIS — I10 Essential (primary) hypertension: Secondary | ICD-10-CM | POA: Diagnosis present

## 2018-09-27 DIAGNOSIS — R404 Transient alteration of awareness: Secondary | ICD-10-CM | POA: Diagnosis not present

## 2018-09-27 DIAGNOSIS — J8410 Pulmonary fibrosis, unspecified: Secondary | ICD-10-CM | POA: Diagnosis present

## 2018-09-27 DIAGNOSIS — J90 Pleural effusion, not elsewhere classified: Secondary | ICD-10-CM | POA: Diagnosis present

## 2018-09-27 DIAGNOSIS — R0902 Hypoxemia: Secondary | ICD-10-CM | POA: Diagnosis not present

## 2018-09-27 DIAGNOSIS — R2689 Other abnormalities of gait and mobility: Secondary | ICD-10-CM | POA: Diagnosis not present

## 2018-09-27 DIAGNOSIS — J181 Lobar pneumonia, unspecified organism: Secondary | ICD-10-CM

## 2018-09-27 DIAGNOSIS — I959 Hypotension, unspecified: Secondary | ICD-10-CM | POA: Diagnosis not present

## 2018-09-27 DIAGNOSIS — R509 Fever, unspecified: Secondary | ICD-10-CM | POA: Diagnosis not present

## 2018-09-27 DIAGNOSIS — E039 Hypothyroidism, unspecified: Secondary | ICD-10-CM | POA: Diagnosis present

## 2018-09-27 DIAGNOSIS — R102 Pelvic and perineal pain: Secondary | ICD-10-CM | POA: Diagnosis not present

## 2018-09-27 DIAGNOSIS — I158 Other secondary hypertension: Secondary | ICD-10-CM | POA: Diagnosis not present

## 2018-09-27 DIAGNOSIS — R410 Disorientation, unspecified: Secondary | ICD-10-CM | POA: Diagnosis not present

## 2018-09-27 DIAGNOSIS — M2550 Pain in unspecified joint: Secondary | ICD-10-CM | POA: Diagnosis not present

## 2018-09-27 HISTORY — DX: Hypothyroidism, unspecified: E03.9

## 2018-09-27 HISTORY — DX: Essential (primary) hypertension: I10

## 2018-09-27 LAB — CBC WITH DIFFERENTIAL/PLATELET
Abs Immature Granulocytes: 0.1 10*3/uL — ABNORMAL HIGH (ref 0.00–0.07)
Basophils Absolute: 0 10*3/uL (ref 0.0–0.1)
Basophils Relative: 0 %
EOS PCT: 0 %
Eosinophils Absolute: 0.1 10*3/uL (ref 0.0–0.5)
HCT: 41.1 % (ref 36.0–46.0)
Hemoglobin: 13 g/dL (ref 12.0–15.0)
Immature Granulocytes: 1 %
Lymphocytes Relative: 6 %
Lymphs Abs: 0.9 10*3/uL (ref 0.7–4.0)
MCH: 29.5 pg (ref 26.0–34.0)
MCHC: 31.6 g/dL (ref 30.0–36.0)
MCV: 93.2 fL (ref 80.0–100.0)
MONO ABS: 1.5 10*3/uL — AB (ref 0.1–1.0)
Monocytes Relative: 10 %
Neutro Abs: 13.3 10*3/uL — ABNORMAL HIGH (ref 1.7–7.7)
Neutrophils Relative %: 83 %
Platelets: 394 10*3/uL (ref 150–400)
RBC: 4.41 MIL/uL (ref 3.87–5.11)
RDW: 14.3 % (ref 11.5–15.5)
WBC: 16 10*3/uL — ABNORMAL HIGH (ref 4.0–10.5)
nRBC: 0 % (ref 0.0–0.2)

## 2018-09-27 LAB — COMPREHENSIVE METABOLIC PANEL
ALT: 11 U/L (ref 0–44)
AST: 20 U/L (ref 15–41)
Albumin: 3.4 g/dL — ABNORMAL LOW (ref 3.5–5.0)
Alkaline Phosphatase: 80 U/L (ref 38–126)
Anion gap: 12 (ref 5–15)
BUN: 38 mg/dL — ABNORMAL HIGH (ref 8–23)
CO2: 22 mmol/L (ref 22–32)
Calcium: 9.1 mg/dL (ref 8.9–10.3)
Chloride: 107 mmol/L (ref 98–111)
Creatinine, Ser: 2.88 mg/dL — ABNORMAL HIGH (ref 0.44–1.00)
GFR calc Af Amer: 15 mL/min — ABNORMAL LOW (ref >60)
GFR, EST NON AFRICAN AMERICAN: 13 mL/min — AB (ref >60)
Glucose, Bld: 150 mg/dL — ABNORMAL HIGH (ref 70–99)
Potassium: 4.2 mmol/L (ref 3.5–5.1)
Sodium: 141 mmol/L (ref 135–145)
Total Bilirubin: 0.7 mg/dL (ref 0.3–1.2)
Total Protein: 7.6 g/dL (ref 6.5–8.1)

## 2018-09-27 LAB — I-STAT CHEM 8, ED
BUN: 35 mg/dL — ABNORMAL HIGH (ref 8–23)
Calcium, Ion: 1.14 mmol/L — ABNORMAL LOW (ref 1.15–1.40)
Chloride: 107 mmol/L (ref 98–111)
Creatinine, Ser: 3 mg/dL — ABNORMAL HIGH (ref 0.44–1.00)
Glucose, Bld: 151 mg/dL — ABNORMAL HIGH (ref 70–99)
HEMATOCRIT: 39 % (ref 36.0–46.0)
Hemoglobin: 13.3 g/dL (ref 12.0–15.0)
Potassium: 4.1 mmol/L (ref 3.5–5.1)
Sodium: 140 mmol/L (ref 135–145)
TCO2: 23 mmol/L (ref 22–32)

## 2018-09-27 LAB — URINALYSIS, ROUTINE W REFLEX MICROSCOPIC
Bacteria, UA: NONE SEEN
Bilirubin Urine: NEGATIVE
Glucose, UA: 150 mg/dL — AB
Hgb urine dipstick: NEGATIVE
Ketones, ur: NEGATIVE mg/dL
Leukocytes, UA: NEGATIVE
Nitrite: NEGATIVE
Protein, ur: >=300 mg/dL — AB
Specific Gravity, Urine: 1.019 (ref 1.005–1.030)
pH: 6 (ref 5.0–8.0)

## 2018-09-27 LAB — INFLUENZA PANEL BY PCR (TYPE A & B)
Influenza A By PCR: NEGATIVE
Influenza B By PCR: NEGATIVE

## 2018-09-27 LAB — I-STAT CG4 LACTIC ACID, ED: Lactic Acid, Venous: 1.44 mmol/L (ref 0.5–1.9)

## 2018-09-27 MED ORDER — LACTATED RINGERS IV SOLN
INTRAVENOUS | Status: DC
  Administered 2018-09-27: 23:00:00 via INTRAVENOUS

## 2018-09-27 MED ORDER — SODIUM CHLORIDE 0.9 % IV BOLUS
500.0000 mL | Freq: Once | INTRAVENOUS | Status: AC
  Administered 2018-09-27: 500 mL via INTRAVENOUS

## 2018-09-27 MED ORDER — LEVOTHYROXINE SODIUM 25 MCG PO TABS
25.0000 ug | ORAL_TABLET | Freq: Every day | ORAL | Status: DC
  Administered 2018-09-28 – 2018-10-06 (×9): 25 ug via ORAL
  Filled 2018-09-27 (×9): qty 1

## 2018-09-27 MED ORDER — SODIUM CHLORIDE 0.9 % IV SOLN
500.0000 mg | INTRAVENOUS | Status: DC
  Administered 2018-09-28: 500 mg via INTRAVENOUS
  Filled 2018-09-27: qty 500

## 2018-09-27 MED ORDER — ACETAMINOPHEN 650 MG RE SUPP: 650.0000 mg | Freq: Four times a day (QID) | RECTAL | Status: DC | PRN

## 2018-09-27 MED ORDER — ONDANSETRON HCL 4 MG PO TABS: 4.0000 mg | ORAL_TABLET | Freq: Four times a day (QID) | ORAL | Status: DC | PRN

## 2018-09-27 MED ORDER — ACETAMINOPHEN 325 MG PO TABS
650.0000 mg | ORAL_TABLET | Freq: Four times a day (QID) | ORAL | Status: DC | PRN
  Administered 2018-09-30 – 2018-10-06 (×2): 650 mg via ORAL
  Filled 2018-09-27 (×3): qty 2

## 2018-09-27 MED ORDER — METOPROLOL SUCCINATE ER 50 MG PO TB24
100.0000 mg | ORAL_TABLET | Freq: Every day | ORAL | Status: DC
  Administered 2018-09-28 – 2018-10-06 (×9): 100 mg via ORAL
  Filled 2018-09-27 (×9): qty 2

## 2018-09-27 MED ORDER — FEBUXOSTAT 40 MG PO TABS
40.0000 mg | ORAL_TABLET | Freq: Every day | ORAL | Status: DC
  Administered 2018-09-28 – 2018-10-06 (×9): 40 mg via ORAL
  Filled 2018-09-27 (×9): qty 1

## 2018-09-27 MED ORDER — SODIUM CHLORIDE 0.9 % IV SOLN
1.0000 g | INTRAVENOUS | Status: DC
  Administered 2018-09-27 – 2018-10-02 (×6): 1 g via INTRAVENOUS
  Filled 2018-09-27 (×3): qty 1
  Filled 2018-09-27 (×2): qty 10
  Filled 2018-09-27 (×2): qty 1

## 2018-09-27 MED ORDER — ACETAMINOPHEN 500 MG PO TABS
500.0000 mg | ORAL_TABLET | Freq: Once | ORAL | Status: AC
  Administered 2018-09-27: 500 mg via ORAL
  Filled 2018-09-27: qty 1

## 2018-09-27 MED ORDER — HYDRALAZINE HCL 20 MG/ML IJ SOLN
5.0000 mg | INTRAMUSCULAR | Status: DC | PRN
  Administered 2018-10-01: 5 mg via INTRAVENOUS
  Filled 2018-09-27 (×2): qty 1

## 2018-09-27 MED ORDER — SODIUM CHLORIDE 0.9 % IV SOLN
500.0000 mg | Freq: Once | INTRAVENOUS | Status: AC
  Administered 2018-09-27: 500 mg via INTRAVENOUS
  Filled 2018-09-27: qty 500

## 2018-09-27 MED ORDER — AMLODIPINE BESYLATE 5 MG PO TABS
5.0000 mg | ORAL_TABLET | Freq: Every day | ORAL | Status: DC
  Administered 2018-09-28 – 2018-10-01 (×4): 5 mg via ORAL
  Filled 2018-09-27 (×4): qty 1

## 2018-09-27 MED ORDER — ONDANSETRON HCL 4 MG/2ML IJ SOLN: 4.0000 mg | Freq: Four times a day (QID) | INTRAMUSCULAR | Status: DC | PRN

## 2018-09-27 MED ORDER — ENOXAPARIN SODIUM 40 MG/0.4ML ~~LOC~~ SOLN
40.0000 mg | Freq: Every day | SUBCUTANEOUS | Status: DC
  Administered 2018-09-28: 40 mg via SUBCUTANEOUS
  Filled 2018-09-27: qty 0.4

## 2018-09-27 MED ORDER — ROSUVASTATIN CALCIUM 5 MG PO TABS
2.5000 mg | ORAL_TABLET | Freq: Every day | ORAL | Status: DC
  Administered 2018-09-28 – 2018-10-06 (×9): 2.5 mg via ORAL
  Filled 2018-09-27 (×9): qty 1

## 2018-09-27 NOTE — ED Provider Notes (Signed)
[facility] DEPT Provider Note   CSN: [id number] Arrival date & time: 09/27/18  1744     History   Chief Complaint No chief complaint on file.   HPI [name] is a 84 y.o. female who is healthy here presenting with weakness, fever, dysuria.  Patient is living at home with her husband and son.  Patient was noted to have a tactile fever for the last 2-3 days.  Patient also has a productive cough as well.  Also patient has been having diffuse weakness fell earlier today and landed on her  buttock.  She was able to ambulate afterwards.   She had a family friend that comes to help her and took her temperature patient that was 101. Patient told friend that she has some dysuria and frequency as well. EMS was unable to attain the temperature but her vitals and CBG was stable.  Patient states that she is weak and she did not know that she had a fever. Per the son, patient has no living will or written DNR.   The history is provided by the patient and a relative.    No past medical history on file.  There are no active problems to display for this patient.   OB History   No obstetric history on file.      Home Medications    Prior to Admission medications   Not on File    Family History No family history on file.  Social History Social History   Tobacco Use  . Smoking status: Not on file  Substance Use Topics  . Alcohol use: Not on file  . Drug use: Not on file     Allergies   Patient has no allergy information on record.   Review of Systems Review of Systems  Constitutional: Positive for chills, fatigue and fever.  Respiratory: Positive for cough.   All other systems reviewed and are negative.    Physical Exam Updated Vital Signs BP (!) 181/62 (BP Location: Right Arm)   Pulse 84   Temp (!) 102 F (38.9 C) (Rectal)   Resp 20   SpO2 98%   Physical Exam Vitals signs and nursing note reviewed.  Constitutional:    Comments: Chronically ill, uncomfortable   HENT:     Head: Normocephalic.     Nose: Nose normal.     Mouth/Throat:     Mouth: Mucous membranes are dry.  Eyes:     Extraocular Movements: Extraocular movements intact.     Pupils: Pupils are equal, round, and reactive to light.  Neck:     Musculoskeletal: Normal range of motion.  Cardiovascular:     Rate and Rhythm: Normal rate and regular rhythm.  Pulmonary:     Comments: Bibasilar crackles  Abdominal:     General: Abdomen is flat.     Palpations: Abdomen is soft.  Musculoskeletal: Normal range of motion.  Skin:    General: Skin is warm.     Capillary Refill: Capillary refill takes less than 2 seconds.     Comments: No obvious sacral decub   Neurological:     General: No focal deficit present.     Mental Status: She is oriented to person, place, and time.  Psychiatric:        Mood and Affect: Mood normal.        Behavior: Behavior normal.      ED Treatments / Results  Labs (all labs ordered are listed, but only abnormal  results are displayed) Labs Reviewed  COMPREHENSIVE METABOLIC PANEL - Abnormal; Notable for the following components:      Result Value   Glucose, Bld 150 (*)    BUN 38 (*)    Creatinine, Ser 2.88 (*)    Albumin 3.4 (*)    GFR calc non Af Amer 13 (*)    GFR calc Af Amer 15 (*)    All other components within normal limits  CBC WITH DIFFERENTIAL/PLATELET - Abnormal; Notable for the following components:   WBC 16.0 (*)    Neutro Abs 13.3 (*)    Monocytes Absolute 1.5 (*)    Abs Immature Granulocytes 0.10 (*)    All other components within normal limits  URINALYSIS, ROUTINE W REFLEX MICROSCOPIC - Abnormal; Notable for the following components:   Glucose, UA 150 (*)    Protein, ur >=300 (*)    Non Squamous Epithelial 0-5 (*)    All other components within normal limits  I-STAT CHEM 8, ED - Abnormal; Notable for the following components:   BUN 35 (*)    Creatinine, Ser 3.00 (*)    Glucose, Bld 151  (*)    Calcium, Ion 1.14 (*)    All other components within normal limits  CULTURE, BLOOD (ROUTINE X 2)  CULTURE, BLOOD (ROUTINE X 2)  URINE CULTURE  I-STAT CG4 LACTIC ACID, ED  I-STAT CG4 LACTIC ACID, ED    EKG EKG Interpretation  Date/Time:  Sunday September 27 2018 18:02:26 EST Ventricular Rate:  81 PR Interval:    QRS Duration: 89 QT Interval:  391 QTC Calculation: 454 R Axis:   -4 Text Interpretation:  Sinus rhythm Anteroseptal infarct, age indeterminate No significant change since last tracing Confirmed by Wandra Arthurs [phone]) on 09/27/2018 7:28:28 PM   Radiology Dg Chest 2 View  Result Date: 09/27/2018 CLINICAL DATA:  Fever EXAM: CHEST - 2 VIEW COMPARISON:  01/28/2009 chest radiograph. FINDINGS: Normal heart size. Mildly tortuous atherosclerotic thoracic aorta. Moderate hiatal hernia. No pneumothorax. No pleural effusion. Patchy reticular opacities at the peripheral left lung base, increased from prior. No pulmonary edema. IMPRESSION: 1. Chronic patchy reticular opacities at the peripheral left lung base, increased from 2010 radiograph, favor progression of pulmonary fibrosis, can not exclude acute superimposed pneumonia. Short-term follow-up PA and lateral chest radiographs suggested. 2. Moderate hiatal hernia. Electronically Signed   By: Ilona Sorrel M.D.   On: 09/27/2018 19:46   Dg Pelvis 1-2 Views  Result Date: 09/27/2018 CLINICAL DATA:  Urinary frequency and weakness with fever. Pelvic pain. EXAM: PELVIS - 1-2 VIEW COMPARISON:  None. FINDINGS: Lumbar spondylosis with disc space narrowing of the included lumbar spine from L4 through S1 is identified. Moderate stool retention is seen within the cecum and ascending colon partially obscuring the right iliac bone. Likewise, the sacrum is suboptimally assessed due to overlying bowel. No acute displaced appearing fracture. No joint dislocation. No suspicious radiopaque calculi. Phlebolith is noted in the right lower pelvis.  IMPRESSION: Lumbar spondylosis. No acute osseous abnormality. Electronically Signed   By: Ashley Royalty M.D.   On: 09/27/2018 19:48    Procedures Procedures (including critical care time)  Medications Ordered in ED Medications  cefTRIAXone (ROCEPHIN) 1 g in sodium chloride 0.9 % 100 mL IVPB (1 g Intravenous New Bag/Given 09/27/18 1842)  azithromycin (ZITHROMAX) 500 mg in sodium chloride 0.9 % 250 mL IVPB (has no administration in time range)  sodium chloride 0.9 % bolus 500 mL (500 mLs Intravenous New Bag/Given 09/27/18 1822)  acetaminophen (TYLENOL) tablet 500 mg (500 mg Oral Given 09/27/18 1841)     Initial Impression / Assessment and Plan / ED Course  I have reviewed the triage vital signs and the nursing notes.  Pertinent labs & imaging results that were available during my care of the patient were reviewed by me and considered in my medical decision making (see chart for details).    [name] is a 84 y.o. female here with cough, fever, dysuria, weakness, fall. Febrile 102 in the ED. Will initiate sepsis workup with labs, CXR, lactate, cultures, UA. Will hydrate and likely need admission. Patient has no known DNR.    8:23 PM Labs showed WBC 16. CXR showed possible pneumonia in addition to pulmonary fibrosis. UA nl. But her Cr is 3. Last Cr was in 2012 and was normal. Unclear if the renal failure is acute or not. Given IVF, rocephin, azithro. Will admit for pneumonia, possible AKI.    Final Clinical Impressions(s) / ED Diagnoses   Final diagnoses:  None    ED Discharge Orders    None       Drenda Freeze, MD 09/27/18 2024

## 2018-09-27 NOTE — H&P (Addendum)
History and Physical    [name] IOX:[id number] DOB: 11/08/1917 DOA: 09/27/2018  PCP: [facility]  Patient coming from: Home.  Chief Complaint: Fall.  Fever.  History was planned by patient's son to the ER physician.  Patient family has dementia.  HPI: [name] is a 84 y.o. female with history of hypertension, hypothyroidism, hyperlipidemia, gout was brought to the ER patient was feeling weak and had a fall at home.  As per the family patient also has been exam productive cough for last 3 days with fever and chills.  Did not lose consciousness or hit her head and after fall.  Patient fell onto her buttock.  Patient also had complained of dysuria.  ED Course: In the ER patient is febrile with temperature of around 102 F with labs showing leukocytosis.  Chest x-ray shows possible pneumonia and interstitial lung disease.  UA was unremarkable.  Cultures were obtained and started on antibiotics.  Patient's creatinine has increased to 2.8 baseline was many years ago in our system which was normal.  Review of Systems: As per HPI, rest all negative.   Past Medical History:  Diagnosis Date  . Hypertension   . Hypothyroidism     History reviewed. No pertinent surgical history.   reports that she has never smoked. She has never used smokeless tobacco. No history on file for alcohol and drug.  No Known Allergies  Family History  Family history unknown: Yes    Prior to Admission medications   Medication Sig Start Date End Date Taking? Authorizing Provider  amLODipine (NORVASC) 5 MG tablet Take 5 mg by mouth daily.   Yes [provider]  febuxostat (ULORIC) 40 MG tablet Take 40 mg by mouth daily.   Yes [provider]  levothyroxine (SYNTHROID, LEVOTHROID) 25 MCG tablet Take 25 mcg by mouth daily before breakfast.   Yes [provider]  lisinopril (PRINIVIL,ZESTRIL) 10 MG tablet Take 15 mg by mouth daily.   Yes [provider]  metoprolol succinate (TOPROL-XL) 100 MG 24 hr tablet Take 100 mg by mouth daily. Take with or immediately following a meal.   Yes [provider]  rosuvastatin (CRESTOR) 5 MG tablet Take 2.5 mg by mouth daily.   Yes [provider]    Physical Exam: Vitals:   09/27/18 1900 09/27/18 2114 09/27/18 2115 09/27/18 2157  BP: (!) 177/64 (!) 123/58  (!) 180/69  Pulse:  62  71  Resp: 12 17  (!) 22  Temp:   98.4 F (36.9 C) 98.2 F (36.8 C)  TempSrc:   Oral   SpO2:  96%  98%      Constitutional: Moderately built and nourished. Vitals:   09/27/18 1900 09/27/18 2114 09/27/18 2115 09/27/18 2157  BP: (!) 177/64 (!) 123/58  (!) 180/69  Pulse:  62  71  Resp: 12 17  (!) 22  Temp:   98.4 F (36.9 C) 98.2 F (36.8 C)  TempSrc:   Oral   SpO2:  96%  98%   Eyes: Anicteric no pallor. ENMT: No discharge from the ears eyes nose or mouth. Neck: No mass felt.  No neck rigidity. Respiratory: No rhonchi or crepitations. Cardiovascular: S1-S2 heard. Abdomen: Soft nontender bowel sounds present. Musculoskeletal: No edema.  No joint effusion. Skin: No rash. Neurologic: Alert awake oriented her name.  Moves all extremities. Psychiatric: Oriented to her name only.   Labs on Admission: I have personally reviewed following labs and imaging studies  CBC:  Recent Labs  Lab 09/27/18 1824 09/27/18 1836  WBC  --  16.0*  NEUTROABS  --  13.3*  HGB 13.3 13.0  HCT 39.0 41.1  MCV  --  93.2  PLT  --  161   Basic Metabolic Panel: Recent Labs  Lab 09/27/18 1824 09/27/18 1836  NA 140 141  K 4.1 4.2  CL 107 107  CO2  --  22  GLUCOSE 151* 150*  BUN 35* 38*  CREATININE 3.00* 2.88*  CALCIUM  --  9.1   GFR: CrCl cannot be calculated (Unknown ideal weight.). Liver Function Tests: Recent Labs  Lab 09/27/18 1836  AST 20  ALT 11  ALKPHOS 80  BILITOT 0.7  PROT 7.6  ALBUMIN 3.4*   No results for input(s): LIPASE, AMYLASE in the last 168 hours. No results for  input(s): AMMONIA in the last 168 hours. Coagulation Profile: No results for input(s): INR, PROTIME in the last 168 hours. Cardiac Enzymes: No results for input(s): CKTOTAL, CKMB, CKMBINDEX, TROPONINI in the last 168 hours. BNP (last 3 results) No results for input(s): PROBNP in the last 8760 hours. HbA1C: No results for input(s): HGBA1C in the last 72 hours. CBG: No results for input(s): GLUCAP in the last 168 hours. Lipid Profile: No results for input(s): CHOL, HDL, LDLCALC, TRIG, CHOLHDL, LDLDIRECT in the last 72 hours. Thyroid Function Tests: No results for input(s): TSH, T4TOTAL, FREET4, T3FREE, THYROIDAB in the last 72 hours. Anemia Panel: No results for input(s): VITAMINB12, FOLATE, FERRITIN, TIBC, IRON, RETICCTPCT in the last 72 hours. Urine analysis:    Component Value Date/Time   COLORURINE YELLOW 09/27/2018 1751   APPEARANCEUR CLEAR 09/27/2018 1751   LABSPEC 1.019 09/27/2018 1751   PHURINE 6.0 09/27/2018 1751   GLUCOSEU 150 (A) 09/27/2018 1751   HGBUR NEGATIVE 09/27/2018 1751   BILIRUBINUR NEGATIVE 09/27/2018 1751   KETONESUR NEGATIVE 09/27/2018 1751   PROTEINUR >=300 (A) 09/27/2018 1751   UROBILINOGEN 0.2 01/28/2009 1325   NITRITE NEGATIVE 09/27/2018 1751   LEUKOCYTESUR NEGATIVE 09/27/2018 1751   Sepsis Labs: @LABRCNTIP (procalcitonin:4,lacticidven:4) )No results found for this or any previous visit (from the past 240 hour(s)).   Radiological Exams on Admission: Dg Chest 2 View  Result Date: 09/27/2018 CLINICAL DATA:  Fever EXAM: CHEST - 2 VIEW COMPARISON:  01/28/2009 chest radiograph. FINDINGS: Normal heart size. Mildly tortuous atherosclerotic thoracic aorta. Moderate hiatal hernia. No pneumothorax. No pleural effusion. Patchy reticular opacities at the peripheral left lung base, increased from prior. No pulmonary edema. IMPRESSION: 1. Chronic patchy reticular opacities at the peripheral left lung base, increased from 2010 radiograph, favor progression of  pulmonary fibrosis, can not exclude acute superimposed pneumonia. Short-term follow-up PA and lateral chest radiographs suggested. 2. Moderate hiatal hernia. Electronically Signed   By: Ilona Sorrel M.D.   On: 09/27/2018 19:46   Dg Pelvis 1-2 Views  Result Date: 09/27/2018 CLINICAL DATA:  Urinary frequency and weakness with fever. Pelvic pain. EXAM: PELVIS - 1-2 VIEW COMPARISON:  None. FINDINGS: Lumbar spondylosis with disc space narrowing of the included lumbar spine from L4 through S1 is identified. Moderate stool retention is seen within the cecum and ascending colon partially obscuring the right iliac bone. Likewise, the sacrum is suboptimally assessed due to overlying bowel. No acute displaced appearing fracture. No joint dislocation. No suspicious radiopaque calculi. Phlebolith is noted in the right lower pelvis. IMPRESSION: Lumbar spondylosis. No acute osseous abnormality. Electronically Signed   By: Ashley Royalty M.D.   On: 09/27/2018 19:48    EKG: Independently  reviewed.  Normal sinus rhythm.  Nonspecific ST-T changes.  Assessment/Plan Principal Problem:   CAP (community acquired pneumonia) Active Problems:   AKI (acute kidney injury) ([place])   Essential hypertension   HLD (hyperlipidemia)   Hypothyroidism    1. Community-acquired pneumonia -patient is on ceftriaxone and Zithromax.  Follow influenza PCR urine for Legionella strep antigen cultures. 2. Acute renal failure -we will gently hydrate.  Hold lisinopril.  UA is unremarkable.  Follow intake output metabolic panel. 3. Hypertension uncontrolled -holding lisinopril due to renal failure.  We will continue metoprolol and amlodipine and keep patient on PRN IV hydralazine. 4. Lipidemia on statins. 5. Hypothyroidism on Synthroid. 6. Possible dementia.  Given that patient has fever acute renal failure pneumonia patient will need close follow-up and admitted as inpatient.   DVT prophylaxis: Lovenox. Code Status: Full code as per the  patient's son. Family Communication: No family at the bedside. Disposition Plan: Home. Consults called: None. Admission status: Inpatient.   Rise Patience MD [facility] Pager [phone].  If 7PM-7AM, please contact night-coverage [email or web address] Password [email or web address]  09/27/2018, 10:02 PM

## 2018-09-27 NOTE — Progress Notes (Signed)
RN getting report, RN requested a current set of vitals signs to ensure that pt is appropriate for our floor. Will reassess pt when current data is available.

## 2018-09-27 NOTE — ED Notes (Addendum)
Report called to the floor. They said they were unable to take her with her current mews score.

## 2018-09-27 NOTE — ED Triage Notes (Signed)
Patient coming from home with c/o urinary frequency and weakness. Patient had 101 temp at home. BP-142/80 P- 88 98% RA. CBG 147

## 2018-09-27 NOTE — ED Notes (Signed)
Bed: RX54 Expected date:  Expected time:  Means of arrival:  Comments: fever

## 2018-09-27 NOTE — Progress Notes (Signed)
ED TO INPATIENT HANDOFF REPORT  Name/Age/Gender [name] 84 y.o. female  Code Status   Home/SNF/Other Home  Chief Complaint fever weakness  Level of Care/Admitting Diagnosis ED Disposition    ED Disposition Condition [facility] Area: [place] [100102]  Level of Care: Telemetry [5]  Admit to tele based on following criteria: Monitor for Ischemic changes  Diagnosis: CAP (community acquired pneumonia) [938182]  Admitting Physician: Rise Patience [phone]  Attending Physician: Rise Patience [phone]  Estimated length of stay: past midnight tomorrow  Certification:: I certify this patient will need inpatient services for at least 2 midnights  PT Class (Do Not Modify): Inpatient [101]  PT Acc Code (Do Not Modify): Private [1]       Medical History No past medical history on file.  Allergies Not on File  IV Location/Drains/Wounds Patient Lines/Drains/Airways Status   Active Line/Drains/Airways    Name:   Placement date:   Placement time:   Site:   Days:   Peripheral IV 09/27/18 Left Antecubital   09/27/18    1819    Antecubital   less than 1   Peripheral IV 09/27/18 Left Hand   09/27/18    1920    Hand   less than 1          Labs/Imaging Results for orders placed or performed during the hospital encounter of 09/27/18 (from the past 48 hour(s))  Urinalysis, Routine w reflex microscopic     Status: Abnormal   Collection Time: 09/27/18  5:51 PM  Result Value Ref Range   Color, Urine YELLOW YELLOW   APPearance CLEAR CLEAR   Specific Gravity, Urine 1.019 1.005 - 1.030   pH 6.0 5.0 - 8.0   Glucose, UA 150 (A) NEGATIVE mg/dL   Hgb urine dipstick NEGATIVE NEGATIVE   Bilirubin Urine NEGATIVE NEGATIVE   Ketones, ur NEGATIVE NEGATIVE mg/dL   Protein, ur >=300 (A) NEGATIVE mg/dL   Nitrite NEGATIVE NEGATIVE   Leukocytes, UA NEGATIVE NEGATIVE   RBC / HPF 0-5 0 - 5 RBC/hpf   WBC, UA 0-5 0 - 5 WBC/hpf   Bacteria, UA  NONE SEEN NONE SEEN   Squamous Epithelial / LPF 0-5 0 - 5   Non Squamous Epithelial 0-5 (A) NONE SEEN    Comment: Performed at [facility], [place] [address] [place], [place] [address]  I-Stat CG4 Lactic Acid, ED     Status: None   Collection Time: 09/27/18  6:24 PM  Result Value Ref Range   Lactic Acid, Venous 1.44 0.5 - 1.9 mmol/L  I-stat Chem 8, ED     Status: Abnormal   Collection Time: 09/27/18  6:24 PM  Result Value Ref Range   Sodium 140 135 - 145 mmol/L   Potassium 4.1 3.5 - 5.1 mmol/L   Chloride 107 98 - 111 mmol/L   BUN 35 (H) 8 - 23 mg/dL   Creatinine, Ser 3.00 (H) 0.44 - 1.00 mg/dL   Glucose, Bld 151 (H) 70 - 99 mg/dL   Calcium, Ion 1.14 (L) 1.15 - 1.40 mmol/L   TCO2 23 22 - 32 mmol/L   Hemoglobin 13.3 12.0 - 15.0 g/dL   HCT 39.0 36.0 - 46.0 %  Comprehensive metabolic panel     Status: Abnormal   Collection Time: 09/27/18  6:36 PM  Result Value Ref Range   Sodium 141 135 - 145 mmol/L   Potassium 4.2 3.5 - 5.1 mmol/L   Chloride 107  98 - 111 mmol/L   CO2 22 22 - 32 mmol/L   Glucose, Bld 150 (H) 70 - 99 mg/dL   BUN 38 (H) 8 - 23 mg/dL   Creatinine, Ser 2.88 (H) 0.44 - 1.00 mg/dL   Calcium 9.1 8.9 - 10.3 mg/dL   Total Protein 7.6 6.5 - 8.1 g/dL   Albumin 3.4 (L) 3.5 - 5.0 g/dL   AST 20 15 - 41 U/L   ALT 11 0 - 44 U/L   Alkaline Phosphatase 80 38 - 126 U/L   Total Bilirubin 0.7 0.3 - 1.2 mg/dL   GFR calc non Af Amer 13 (L) >60 mL/min   GFR calc Af Amer 15 (L) >60 mL/min   Anion gap 12 5 - 15    Comment: Performed at [facility], [place] [address], [place], [place] [address]  CBC WITH DIFFERENTIAL     Status: Abnormal   Collection Time: 09/27/18  6:36 PM  Result Value Ref Range   WBC 16.0 (H) 4.0 - 10.5 K/uL   RBC 4.41 3.87 - 5.11 MIL/uL   Hemoglobin 13.0 12.0 - 15.0 g/dL   HCT 41.1 36.0 - 46.0 %   MCV 93.2 80.0 - 100.0 fL   MCH 29.5 26.0 - 34.0 pg   MCHC 31.6 30.0 - 36.0 g/dL   RDW 14.3 11.5 - 15.5 %   Platelets 394 150 -  400 K/uL   nRBC 0.0 0.0 - 0.2 %   Neutrophils Relative % 83 %   Neutro Abs 13.3 (H) 1.7 - 7.7 K/uL   Lymphocytes Relative 6 %   Lymphs Abs 0.9 0.7 - 4.0 K/uL   Monocytes Relative 10 %   Monocytes Absolute 1.5 (H) 0.1 - 1.0 K/uL   Eosinophils Relative 0 %   Eosinophils Absolute 0.1 0.0 - 0.5 K/uL   Basophils Relative 0 %   Basophils Absolute 0.0 0.0 - 0.1 K/uL   Immature Granulocytes 1 %   Abs Immature Granulocytes 0.10 (H) 0.00 - 0.07 K/uL    Comment: Performed at [facility], [place] [address] [place], [place] [address]   Dg Chest 2 View  Result Date: 09/27/2018 CLINICAL DATA:  Fever EXAM: CHEST - 2 VIEW COMPARISON:  01/28/2009 chest radiograph. FINDINGS: Normal heart size. Mildly tortuous atherosclerotic thoracic aorta. Moderate hiatal hernia. No pneumothorax. No pleural effusion. Patchy reticular opacities at the peripheral left lung base, increased from prior. No pulmonary edema. IMPRESSION: 1. Chronic patchy reticular opacities at the peripheral left lung base, increased from 2010 radiograph, favor progression of pulmonary fibrosis, can not exclude acute superimposed pneumonia. Short-term follow-up PA and lateral chest radiographs suggested. 2. Moderate hiatal hernia. Electronically Signed   By: Ilona Sorrel M.D.   On: 09/27/2018 19:46   Dg Pelvis 1-2 Views  Result Date: 09/27/2018 CLINICAL DATA:  Urinary frequency and weakness with fever. Pelvic pain. EXAM: PELVIS - 1-2 VIEW COMPARISON:  None. FINDINGS: Lumbar spondylosis with disc space narrowing of the included lumbar spine from L4 through S1 is identified. Moderate stool retention is seen within the cecum and ascending colon partially obscuring the right iliac bone. Likewise, the sacrum is suboptimally assessed due to overlying bowel. No acute displaced appearing fracture. No joint dislocation. No suspicious radiopaque calculi. Phlebolith is noted in the right lower pelvis. IMPRESSION: Lumbar spondylosis. No acute  osseous abnormality. Electronically Signed   By: Ashley Royalty M.D.   On: 09/27/2018 19:48    Pending Labs Unresulted Labs (From admission, onward)  Start     Ordered   09/27/18 2024  Influenza panel by PCR (type A & B)  ONCE - STAT,   STAT     09/27/18 2023   09/27/18 1751  Blood Culture (routine x 2)  BLOOD CULTURE X 2,   STAT     09/27/18 1751   09/27/18 1751  Urine culture  ONCE - STAT,   STAT     09/27/18 1751          Vitals/Pain Today's Vitals   09/27/18 1800 09/27/18 1801 09/27/18 1900  BP: (!) 181/62 (!) 181/62 (!) 177/64  Pulse: 81 84   Resp:  20 12  Temp:  (!) 102 F (38.9 C)   TempSrc:  Rectal   SpO2: 97% 98%     Isolation Precautions No active isolations  Medications Medications  cefTRIAXone (ROCEPHIN) 1 g in sodium chloride 0.9 % 100 mL IVPB (0 g Intravenous Stopped 09/27/18 1912)  azithromycin (ZITHROMAX) 500 mg in sodium chloride 0.9 % 250 mL IVPB (500 mg Intravenous New Bag/Given 09/27/18 2034)  sodium chloride 0.9 % bolus 500 mL (0 mLs Intravenous Stopped 09/27/18 1853)  acetaminophen (TYLENOL) tablet 500 mg (500 mg Oral Given 09/27/18 1841)    Mobility walks

## 2018-09-28 ENCOUNTER — Inpatient Hospital Stay (HOSPITAL_COMMUNITY): Payer: Medicare Other

## 2018-09-28 DIAGNOSIS — E785 Hyperlipidemia, unspecified: Secondary | ICD-10-CM

## 2018-09-28 DIAGNOSIS — D72829 Elevated white blood cell count, unspecified: Secondary | ICD-10-CM

## 2018-09-28 LAB — BASIC METABOLIC PANEL
Anion gap: 10 (ref 5–15)
BUN: 34 mg/dL — ABNORMAL HIGH (ref 8–23)
CHLORIDE: 112 mmol/L — AB (ref 98–111)
CO2: 20 mmol/L — ABNORMAL LOW (ref 22–32)
CREATININE: 2.57 mg/dL — AB (ref 0.44–1.00)
Calcium: 8.6 mg/dL — ABNORMAL LOW (ref 8.9–10.3)
GFR calc Af Amer: 17 mL/min — ABNORMAL LOW (ref >60)
GFR calc non Af Amer: 15 mL/min — ABNORMAL LOW (ref >60)
Glucose, Bld: 104 mg/dL — ABNORMAL HIGH (ref 70–99)
Potassium: 3.9 mmol/L (ref 3.5–5.1)
Sodium: 142 mmol/L (ref 135–145)

## 2018-09-28 LAB — CBC
HCT: 36.6 % (ref 36.0–46.0)
Hemoglobin: 11.5 g/dL — ABNORMAL LOW (ref 12.0–15.0)
MCH: 29.8 pg (ref 26.0–34.0)
MCHC: 31.4 g/dL (ref 30.0–36.0)
MCV: 94.8 fL (ref 80.0–100.0)
NRBC: 0 % (ref 0.0–0.2)
Platelets: 331 10*3/uL (ref 150–400)
RBC: 3.86 MIL/uL — ABNORMAL LOW (ref 3.87–5.11)
RDW: 13.9 % (ref 11.5–15.5)
WBC: 15.8 10*3/uL — ABNORMAL HIGH (ref 4.0–10.5)

## 2018-09-28 LAB — STREP PNEUMONIAE URINARY ANTIGEN: Strep Pneumo Urinary Antigen: NEGATIVE

## 2018-09-28 MED ORDER — SODIUM CHLORIDE 0.9 % IV SOLN
INTRAVENOUS | Status: DC
  Administered 2018-09-28: 12:00:00 via INTRAVENOUS

## 2018-09-28 MED ORDER — ENOXAPARIN SODIUM 30 MG/0.3ML ~~LOC~~ SOLN
30.0000 mg | Freq: Every day | SUBCUTANEOUS | Status: DC
  Administered 2018-09-29 – 2018-10-06 (×8): 30 mg via SUBCUTANEOUS
  Filled 2018-09-28 (×8): qty 0.3

## 2018-09-28 NOTE — Progress Notes (Signed)
Patient ID: [name], female   DOB: 06-14-1918, 84 y.o.   MRN: [id number]  PROGRESS NOTE    [name]  ZJI:[id number] DOB: 1917-10-31 DOA: 09/27/2018 PCP: Gynecology, [facility]   Brief Narrative:  84 year old female with history of hypertension, hypothyroidism, hyperlipidemia, dementia, gout presented with weakness, fall with fever and chills.  Chest x-ray showed possible pneumonia and interstitial lung disease.  She was started on IV antibiotics and intravenous fluids.   Assessment & Plan:   Principal Problem:   CAP (community acquired pneumonia) Active Problems:   AKI (acute kidney injury) ([place])   Essential hypertension   HLD (hyperlipidemia)   Hypothyroidism   Probable community-acquired bacterial left lower lobe pneumonia -Continue Rocephin and Zithromax.  Follow cultures.  Follow urine Legionella and streptococcal antigen.  Flu a and B are negative.  Oxygen supplementation as needed -SLP evaluation  Probable pulmonary fibrosis with progression -Chest x-ray suggestive of above.  Might need outpatient pulmonary evaluation.  If respiratory status worsens, will get inpatient pulmonary consultation and might need CT of the chest  Leukocytosis -Monitor.  Acute renal failure -Unclear if the patient has what stage of chronic kidney disease.  Lisinopril on hold.  Continue IV fluids.  Creatinine improving.  Monitor.  Renal ultrasound  Hyperlipidemia -Continue statins  Hypertension -Blood pressure on the higher side.  Monitor.  Continue metoprolol and amlodipine  Hypothyroidism  -Continue Synthroid  Possible dementia -Monitor mental status.  Fall precautions  Generalized conditioning  -PT evaluation  DVT prophylaxis: Lovenox Code Status: Full Family Communication: None at bedside Disposition Plan: Depends on clinical outcome  Consultants: None Procedures: None  Antimicrobials: Rocephin and Zithromax from 09/27/2018  onwards   Subjective: Patient seen and examined at bedside.  She is lying in bed, awake, poor historian.  States that her breathing is better.  Slightly confused.  No overnight fever or vomiting reported.  Objective: Vitals:   09/27/18 2115 09/27/18 2157 09/28/18 0108 09/28/18 0515  BP:  (!) 180/69 (!) 160/81 (!) 157/82  Pulse:  71 70 74  Resp:  (!) 22  (!) 22  Temp: 98.4 F (36.9 C) 98.2 F (36.8 C)  98.2 F (36.8 C)  TempSrc: Oral     SpO2:  98%  98%    Intake/Output Summary (Last 24 hours) at 09/28/2018 1055 Last data filed at 09/28/2018 0600 Gross per 24 hour  Intake 945.26 ml  Output -  Net 945.26 ml   There were no vitals filed for this visit.  Examination:  General exam: Appears calm and comfortable.  Elderly female lying in bed.  Awake but slightly confused to time. Respiratory system: Bilateral decreased breath sounds at bases, some scattered left basilar crackles.  No wheezing.  Intermittent tachypnea Cardiovascular system: S1 & S2 heard, Rate controlled Gastrointestinal system: Abdomen is nondistended, soft and nontender. Normal bowel sounds heard. Extremities: No cyanosis, clubbing, edema    Data Reviewed: I have personally reviewed following labs and imaging studies  CBC: Recent Labs  Lab 09/27/18 1824 09/27/18 1836 09/28/18 0540  WBC  --  16.0* 15.8*  NEUTROABS  --  13.3*  --   HGB 13.3 13.0 11.5*  HCT 39.0 41.1 36.6  MCV  --  93.2 94.8  PLT  --  394 175   Basic Metabolic Panel: Recent Labs  Lab 09/27/18 1824 09/27/18 1836 09/28/18 0540  NA 140 141 142  K 4.1 4.2 3.9  CL 107 107 112*  CO2  --  22 20*  GLUCOSE 151*  150* 104*  BUN 35* 38* 34*  CREATININE 3.00* 2.88* 2.57*  CALCIUM  --  9.1 8.6*   GFR: CrCl cannot be calculated (Unknown ideal weight.). Liver Function Tests: Recent Labs  Lab 09/27/18 1836  AST 20  ALT 11  ALKPHOS 80  BILITOT 0.7  PROT 7.6  ALBUMIN 3.4*   No results for input(s): LIPASE, AMYLASE in the last 168  hours. No results for input(s): AMMONIA in the last 168 hours. Coagulation Profile: No results for input(s): INR, PROTIME in the last 168 hours. Cardiac Enzymes: No results for input(s): CKTOTAL, CKMB, CKMBINDEX, TROPONINI in the last 168 hours. BNP (last 3 results) No results for input(s): PROBNP in the last 8760 hours. HbA1C: No results for input(s): HGBA1C in the last 72 hours. CBG: No results for input(s): GLUCAP in the last 168 hours. Lipid Profile: No results for input(s): CHOL, HDL, LDLCALC, TRIG, CHOLHDL, LDLDIRECT in the last 72 hours. Thyroid Function Tests: No results for input(s): TSH, T4TOTAL, FREET4, T3FREE, THYROIDAB in the last 72 hours. Anemia Panel: No results for input(s): VITAMINB12, FOLATE, FERRITIN, TIBC, IRON, RETICCTPCT in the last 72 hours. Sepsis Labs: Recent Labs  Lab 09/27/18 1824  LATICACIDVEN 1.44    No results found for this or any previous visit (from the past 240 hour(s)).       Radiology Studies: Dg Chest 2 View  Result Date: 09/27/2018 CLINICAL DATA:  Fever EXAM: CHEST - 2 VIEW COMPARISON:  01/28/2009 chest radiograph. FINDINGS: Normal heart size. Mildly tortuous atherosclerotic thoracic aorta. Moderate hiatal hernia. No pneumothorax. No pleural effusion. Patchy reticular opacities at the peripheral left lung base, increased from prior. No pulmonary edema. IMPRESSION: 1. Chronic patchy reticular opacities at the peripheral left lung base, increased from 2010 radiograph, favor progression of pulmonary fibrosis, can not exclude acute superimposed pneumonia. Short-term follow-up PA and lateral chest radiographs suggested. 2. Moderate hiatal hernia. Electronically Signed   By: Ilona Sorrel M.D.   On: 09/27/2018 19:46   Dg Pelvis 1-2 Views  Result Date: 09/27/2018 CLINICAL DATA:  Urinary frequency and weakness with fever. Pelvic pain. EXAM: PELVIS - 1-2 VIEW COMPARISON:  None. FINDINGS: Lumbar spondylosis with disc space narrowing of the included  lumbar spine from L4 through S1 is identified. Moderate stool retention is seen within the cecum and ascending colon partially obscuring the right iliac bone. Likewise, the sacrum is suboptimally assessed due to overlying bowel. No acute displaced appearing fracture. No joint dislocation. No suspicious radiopaque calculi. Phlebolith is noted in the right lower pelvis. IMPRESSION: Lumbar spondylosis. No acute osseous abnormality. Electronically Signed   By: Ashley Royalty M.D.   On: 09/27/2018 19:48        Scheduled Meds: . amLODipine  5 mg Oral Daily  . enoxaparin (LOVENOX) injection  40 mg Subcutaneous Daily  . febuxostat  40 mg Oral Daily  . levothyroxine  25 mcg Oral QAC breakfast  . metoprolol succinate  100 mg Oral Daily  . rosuvastatin  2.5 mg Oral q1800   Continuous Infusions: . azithromycin    . cefTRIAXone (ROCEPHIN)  IV Stopped (09/27/18 1912)  . lactated ringers 50 mL/hr at 09/28/18 0600     LOS: 1 day        Aline August, MD [facility] Pager [phone]  If 7PM-7AM, please contact night-coverage [email or web address] Password [email or web address] 09/28/2018, 10:55 AM

## 2018-09-28 NOTE — Evaluation (Signed)
Physical Therapy Evaluation Patient Details Name: [name] MRN: [id number] DOB: 04-Oct-1917 Today's Date: 09/28/2018   History of Present Illness  [name] is a 84 y.o. female with history of hypertension, hypothyroidism, hyperlipidemia, gout was brought to the ER 09/27/18 with  weakmess, fall at home, cough, fever, dysuria.  Chest x-ray shows possible pneumonia and interstitial lung disease  Clinical Impression  The patient required multimodal cues for safety and mobility. Assisted patient to [facility] and back to bed. Skin tears present on right leg, bleeding. RN notified and placed  Allevyn. Patient not oriented to time and place. A son is in the room but not the one with whom she resides. Patient currently will require assistance for mobility. Pt admitted with above diagnosis. Pt currently with functional limitations due to the deficits listed below (see PT Problem List).  Pt will benefit from skilled PT to increase their independence and safety with mobility to allow discharge to the venue listed below.       Follow Up Recommendations SNF    Equipment Recommendations  None recommended by PT    Recommendations for Other Services       Precautions / Restrictions Precautions Precautions: Fall Precaution Comments: very thin skin, right leg skin tears      Mobility  Bed Mobility Overal bed mobility: Needs Assistance Bed Mobility: Supine to Sit;Sit to Supine     Supine to sit: Mod assist Sit to supine: Mod assist   General bed mobility comments: assist  with trunk and legs, extra time  Transfers Overall transfer level: Needs assistance Equipment used: Rolling walker (2 wheeled) Transfers: Sit to/from [facility] Sit to Stand: Mod assist Stand pivot transfers: Mod assist       General transfer comment: steady assist to rise, multimodal cues for safety, frequent cues for task. Assisted to [facility] then back to bed, partly using  RW. Patient with  decreased following directions for sidestepping  Ambulation/Gait                Stairs            Wheelchair Mobility    Modified Rankin (Stroke Patients Only)       Balance Overall balance assessment: Needs assistance;History of Falls Sitting-balance support: Feet supported;Bilateral upper extremity supported Sitting balance-Leahy Scale: Fair     Standing balance support: During functional activity;Bilateral upper extremity supported Standing balance-Leahy Scale: Poor                               Pertinent Vitals/Pain Pain Assessment: No/denies pain    Home Living Family/patient expects to be discharged to:: Private residence Living Arrangements: Spouse/significant other;Children Available Help at Discharge: Family;Available 24 hours/day Type of Home: House Home Access: Level entry     Home Layout: Two level;Full bath on main level Home Equipment: Walker - 4 wheels Additional Comments: lives w.elderly spouse and son    Prior Function           Comments: per son(not primary caregiver), patient ambualted with a rollator ad lib     Hand Dominance        Extremity/Trunk Assessment   Upper Extremity Assessment Upper Extremity Assessment: Generalized weakness    Lower Extremity Assessment Lower Extremity Assessment: Generalized weakness    Cervical / Trunk Assessment Cervical / Trunk Assessment: Kyphotic  Communication   Communication: No difficulties  Cognition Arousal/Alertness: Awake/alert Behavior During Therapy: Restless   Area of  Impairment: Orientation;Memory;Safety/judgement                 Orientation Level: Place;Time;Situation   Memory: Decreased short-term memory   Safety/Judgement: Decreased awareness of deficits;Decreased awareness of safety            General Comments      Exercises     Assessment/Plan    PT Assessment Patient needs continued PT services  PT Problem List Decreased  strength;Decreased activity tolerance;Decreased mobility;Decreased knowledge of precautions;Decreased safety awareness;Decreased knowledge of use of DME;Decreased skin integrity;Decreased cognition       PT Treatment Interventions DME instruction;Therapeutic exercise;Gait training;Functional mobility training;Therapeutic activities;Patient/family education    PT Goals (Current goals can be found in the Care Plan section)  Acute Rehab PT Goals Patient Stated Goal: agreed to get OOB to [facility] PT Goal Formulation: With patient/family Time For Goal Achievement: 10/12/18 Potential to Achieve Goals: Fair    Frequency Min 2X/week   Barriers to discharge        Co-evaluation               AM-PAC PT "6 Clicks" Mobility  Outcome Measure Help needed turning from your back to your side while in a flat bed without using bedrails?: A Lot Help needed moving from lying on your back to sitting on the side of a flat bed without using bedrails?: A Lot Help needed moving to and from a bed to a chair (including a wheelchair)?: Total Help needed standing up from a chair using your arms (e.g., wheelchair or bedside chair)?: Total Help needed to walk in hospital room?: Total Help needed climbing 3-5 steps with a railing? : Total 6 Click Score: 8    End of Session   Activity Tolerance: Patient tolerated treatment well Patient left: in bed;with call bell/phone within reach;with bed alarm set;with family/visitor present Nurse Communication: Mobility status PT Visit Diagnosis: Unsteadiness on feet (R26.81)    Time: 5038-8828 PT Time Calculation (min) (ACUTE ONLY): 42 min   Charges:   PT Evaluation $PT Eval Low Complexity: 1 Low PT Treatments $Therapeutic Activity: 8-22 mins $Self Care/Home Management: [place] Pager [phone] Office [phone]   [name], [place] 09/28/2018, 5:16 PM

## 2018-09-29 DIAGNOSIS — E039 Hypothyroidism, unspecified: Secondary | ICD-10-CM

## 2018-09-29 DIAGNOSIS — F0390 Unspecified dementia without behavioral disturbance: Secondary | ICD-10-CM

## 2018-09-29 LAB — CBC WITH DIFFERENTIAL/PLATELET
Abs Immature Granulocytes: 0.1 10*3/uL — ABNORMAL HIGH (ref 0.00–0.07)
Basophils Absolute: 0 10*3/uL (ref 0.0–0.1)
Basophils Relative: 0 %
Eosinophils Absolute: 0.1 10*3/uL (ref 0.0–0.5)
Eosinophils Relative: 1 %
HCT: 32.4 % — ABNORMAL LOW (ref 36.0–46.0)
HEMOGLOBIN: 10.2 g/dL — AB (ref 12.0–15.0)
Immature Granulocytes: 1 %
LYMPHS ABS: 1.2 10*3/uL (ref 0.7–4.0)
Lymphocytes Relative: 9 %
MCH: 30.4 pg (ref 26.0–34.0)
MCHC: 31.5 g/dL (ref 30.0–36.0)
MCV: 96.4 fL (ref 80.0–100.0)
Monocytes Absolute: 1.1 10*3/uL — ABNORMAL HIGH (ref 0.1–1.0)
Monocytes Relative: 9 %
NEUTROS PCT: 80 %
Neutro Abs: 10.3 10*3/uL — ABNORMAL HIGH (ref 1.7–7.7)
Platelets: 307 10*3/uL (ref 150–400)
RBC: 3.36 MIL/uL — ABNORMAL LOW (ref 3.87–5.11)
RDW: 13.9 % (ref 11.5–15.5)
WBC: 12.9 10*3/uL — ABNORMAL HIGH (ref 4.0–10.5)
nRBC: 0 % (ref 0.0–0.2)

## 2018-09-29 LAB — URINE CULTURE: Culture: NO GROWTH

## 2018-09-29 LAB — COMPREHENSIVE METABOLIC PANEL
ALT: 10 U/L (ref 0–44)
AST: 16 U/L (ref 15–41)
Albumin: 2.4 g/dL — ABNORMAL LOW (ref 3.5–5.0)
Alkaline Phosphatase: 63 U/L (ref 38–126)
Anion gap: 11 (ref 5–15)
BUN: 30 mg/dL — AB (ref 8–23)
CO2: 17 mmol/L — ABNORMAL LOW (ref 22–32)
Calcium: 7.9 mg/dL — ABNORMAL LOW (ref 8.9–10.3)
Chloride: 114 mmol/L — ABNORMAL HIGH (ref 98–111)
Creatinine, Ser: 2.62 mg/dL — ABNORMAL HIGH (ref 0.44–1.00)
GFR calc Af Amer: 17 mL/min — ABNORMAL LOW (ref >60)
GFR calc non Af Amer: 15 mL/min — ABNORMAL LOW (ref >60)
Glucose, Bld: 77 mg/dL (ref 70–99)
Potassium: 3.6 mmol/L (ref 3.5–5.1)
Sodium: 142 mmol/L (ref 135–145)
Total Bilirubin: 0.8 mg/dL (ref 0.3–1.2)
Total Protein: 5.7 g/dL — ABNORMAL LOW (ref 6.5–8.1)

## 2018-09-29 LAB — AMMONIA: Ammonia: 18 umol/L (ref 9–35)

## 2018-09-29 LAB — LEGIONELLA PNEUMOPHILA SEROGP 1 UR AG: L. pneumophila Serogp 1 Ur Ag: NEGATIVE

## 2018-09-29 LAB — TSH: TSH: 2.613 u[IU]/mL (ref 0.350–4.500)

## 2018-09-29 LAB — MAGNESIUM: Magnesium: 1.7 mg/dL (ref 1.7–2.4)

## 2018-09-29 LAB — VITAMIN B12: Vitamin B-12: 853 pg/mL (ref 180–914)

## 2018-09-29 MED ORDER — AZITHROMYCIN 250 MG PO TABS
500.0000 mg | ORAL_TABLET | ORAL | Status: DC
  Administered 2018-09-29 – 2018-10-02 (×4): 500 mg via ORAL
  Filled 2018-09-29 (×4): qty 2

## 2018-09-29 MED ORDER — SODIUM BICARBONATE 8.4 % IV SOLN
INTRAVENOUS | Status: DC
  Administered 2018-09-29: 10:00:00 via INTRAVENOUS
  Filled 2018-09-29 (×3): qty 100

## 2018-09-29 NOTE — NC FL2 (Signed)
[place] LEVEL OF CARE SCREENING TOOL     IDENTIFICATION  Patient Name: [name] Birthdate: [birth year] Sex: female Admission Date (Current Location): 09/27/2018  [facility] and [place] Number:  Herbalist and Address:  [facility],  [place] [address], [place]      Provider Number: [id number]  Attending Physician Name and Address:  Aline August, MD  Relative Name and Phone Number:       Current Level of Care: Hospital Recommended Level of Care: [place] Prior Approval Number:    Date Approved/Denied: 09/29/18 PASRR Number: [id number]  Discharge Plan: SNF    Current Diagnoses: Patient Active Problem List   Diagnosis Date Noted  . CAP (community acquired pneumonia) 09/27/2018  . AKI (acute kidney injury) ([place]) 09/27/2018  . Essential hypertension 09/27/2018  . HLD (hyperlipidemia) 09/27/2018  . Hypothyroidism 09/27/2018    Orientation RESPIRATION BLADDER Height & Weight     Self, Place  Normal Incontinent Weight: 136 lb 1.6 oz (61.7 kg) Height:  5' (152.4 cm)  BEHAVIORAL SYMPTOMS/MOOD NEUROLOGICAL BOWEL NUTRITION STATUS      Continent Diet(See dc  summary)  AMBULATORY STATUS COMMUNICATION OF NEEDS Skin   Extensive Assist Verbally Normal                       Personal Care Assistance Level of Assistance  Bathing, Feeding, Dressing Bathing Assistance: Limited assistance Feeding assistance: Independent Dressing Assistance: Limited assistance     Functional Limitations Info  Sight, Hearing, Speech Sight Info: Adequate Hearing Info: Adequate Speech Info: Adequate    SPECIAL CARE FACTORS FREQUENCY  PT (By licensed PT)     PT Frequency: 5x/week              Contractures Contractures Info: Not present    Additional Factors Info  Code Status, Allergies Code Status Info: NKA Allergies Info: Full           Current Medications (09/29/2018):  This is the current hospital  active medication list Current Facility-Administered Medications  Medication Dose Route Frequency Provider Last Rate Last Dose  . acetaminophen (TYLENOL) tablet 650 mg  650 mg Oral Q6H PRN Rise Patience, MD       Or  . acetaminophen (TYLENOL) suppository 650 mg  650 mg Rectal Q6H PRN Rise Patience, MD      . amLODipine (NORVASC) tablet 5 mg  5 mg Oral Daily Rise Patience, MD   5 mg at 09/29/18 6578  . azithromycin (ZITHROMAX) tablet 500 mg  500 mg Oral Q24H Shade, Haze Justin, RPH      . cefTRIAXone (ROCEPHIN) 1 g in sodium chloride 0.9 % 100 mL IVPB  1 g Intravenous Q24H Rise Patience, MD   Stopped at 09/28/18 1832  . enoxaparin (LOVENOX) injection 30 mg  30 mg Subcutaneous Daily Aline August, MD   30 mg at 09/29/18 0924  . febuxostat (ULORIC) tablet 40 mg  40 mg Oral Daily Rise Patience, MD   40 mg at 09/29/18 4696  . hydrALAZINE (APRESOLINE) injection 5 mg  5 mg Intravenous Q4H PRN Rise Patience, MD      . levothyroxine (SYNTHROID, LEVOTHROID) tablet 25 mcg  25 mcg Oral QAC breakfast Rise Patience, MD   25 mcg at 09/29/18 0606  . metoprolol succinate (TOPROL-XL) 24 hr tablet 100 mg  100 mg Oral Daily Rise Patience, MD   100 mg at 09/29/18 2952  .  ondansetron (ZOFRAN) tablet 4 mg  4 mg Oral Q6H PRN Rise Patience, MD       Or  . ondansetron [facility]) injection 4 mg  4 mg Intravenous Q6H PRN Rise Patience, MD      . rosuvastatin (CRESTOR) tablet 2.5 mg  2.5 mg Oral q1800 Rise Patience, MD   2.5 mg at 09/28/18 1758  . sodium bicarbonate 100 mEq in dextrose 5 % 1,000 mL infusion   Intravenous Continuous Aline August, MD 75 mL/hr at 09/29/18 1019       Discharge Medications: Please see discharge summary for a list of discharge medications.  Relevant Imaging Results:  Relevant Lab Results:   Additional Information ssn: [id number]  [name], LCSW

## 2018-09-29 NOTE — Evaluation (Signed)
Clinical/Bedside Swallow Evaluation Patient Details  Name: [name] MRN: [id number] Date of Birth: 29-Feb-1918  Today's Date: 09/29/2018 Time: SLP Start Time (ACUTE ONLY): 6606 SLP Stop Time (ACUTE ONLY): 1104 SLP Time Calculation (min) (ACUTE ONLY): 23 min  Past Medical History:  Past Medical History:  Diagnosis Date  . Hypertension   . Hypothyroidism    Past Surgical History: History reviewed. No pertinent surgical history. HPI:  84 year old female with history of hypertension, hypothyroidism, hyperlipidemia, dementia, gout presented with weakness, fall with fever and chills.  Chest x-ray showed possible pneumonia and interstitial lung disease.  She was started on IV antibiotics and intravenous fluids.   Assessment / Plan / Recommendation Clinical Impression  Pt presents with functional swallow with no indications of dysphagia.  Pt/family report no hx of swallowing difficulties.  Pt demonstrates adequate mastication, seemingly brisk swallow response.  She passed three oz water test without any concerns for aspiration.  Presents with adequate respiratory/swallowing reciprocity.  Recommend continued regular consistency diet, thin liquids.  No SLP f/u needed - our service will sign off.  SLP Visit Diagnosis: Dysphagia, unspecified (R13.10)    Aspiration Risk  No limitations    Diet Recommendation   regular solids, thin liquids  Medication Administration: Whole meds with liquid    Other  Recommendations Oral Care Recommendations: Oral care BID   Follow up Recommendations        Frequency and Duration            Prognosis        Swallow Study   General HPI: 84 year old female with history of hypertension, hypothyroidism, hyperlipidemia, dementia, gout presented with weakness, fall with fever and chills.  Chest x-ray showed possible pneumonia and interstitial lung disease.  She was started on IV antibiotics and intravenous fluids. Type of Study: Bedside Swallow  Evaluation Previous Swallow Assessment: no Diet Prior to this Study: Regular;Thin liquids Temperature Spikes Noted: No Respiratory Status: Room air History of Recent Intubation: No Behavior/Cognition: Alert;Cooperative;Pleasant mood;Confused Oral Cavity Assessment: Within Functional Limits Oral Care Completed by SLP: No Oral Cavity - Dentition: Adequate natural dentition Vision: Functional for self-feeding Self-Feeding Abilities: Able to feed self Patient Positioning: Upright in bed Baseline Vocal Quality: Normal Volitional Cough: Strong Volitional Swallow: Able to elicit    Oral/Motor/Sensory Function Overall Oral Motor/Sensory Function: Within functional limits   Ice Chips Ice chips: Within functional limits   Thin Liquid Thin Liquid: Within functional limits    Nectar Thick Nectar Thick Liquid: Not tested   Honey Thick Honey Thick Liquid: Not tested   Puree Puree: Within functional limits   Solid     Solid: Within functional limits      Juan Quam Laurice 09/29/2018,11:12 AM  Estill Bamberg L. Tivis Ringer, [place] Office number [phone] Pager [phone]

## 2018-09-29 NOTE — Clinical Social Work Note (Signed)
Clinical Social Work Assessment  Patient Details  Name: [name] MRN: [id number] Date of Birth: 12-25-1917  Date of referral:  09/29/18               Reason for consult:  Facility Placement                Permission sought to share information with:  Family Supports Permission granted to share information::  Yes, Verbal Permission Granted  Name::     Spouse and children  Agency::  SNF  Relationship::     Contact Information:     Housing/Transportation Living arrangements for the past 2 months:  Single Family Home Source of Information:  Patient, Spouse, Adult Children Patient Interpreter Needed:  None Criminal Activity/Legal Involvement Pertinent to Current Situation/Hospitalization:  No - Comment as needed Significant Relationships:  Adult Children, Spouse Lives with:  Spouse Do you feel safe going back to the place where you live?  Yes Need for family participation in patient care:  Yes (Comment)  Care giving concerns:  [name] is a 84 y.o. female with history of hypertension, hypothyroidism, hyperlipidemia, gout was brought to the ER patient was feeling weak and had a fall at home.  As per the family patient also has been exam productive cough for last 3 days with fever and chills.  Social Worker assessment / plan:  LCSW consulted for SNF placement.   LCSW met with patient and family at bedside. Patients spouse, [name] and son [name] are present.   Patient and family are agreeable to SNF for rehab. LCSW explained referral process.   According to family patient used a walker at base line and was independent in other ADLs.   Family reports that patient has [employer]are in addition to Medicare and asked questions about how to get it added to patients account.   PLAN: SNF at [facility]   Employment status:  Retired Forensic scientist:  Medicare PT Recommendations:  [address] / Referral to community resources:     Patient/Family's Response  to care:  Family thanked LCSW for visit and resources  Patient/Family's Understanding of and Emotional Response to Diagnosis, Current Treatment, and Prognosis:  Patients family is realistic about patients condition and plan of care.   Emotional Assessment Appearance:  Appears stated age Attitude/Demeanor/Rapport:  Engaged Affect (typically observed):  Accepting, Calm, Pleasant Orientation:  Oriented to Self, Oriented to Place Alcohol / Substance use:  Not Applicable Psych involvement (Current and /or in the community):  No (Comment)  Discharge Needs  Concerns to be addressed:  No discharge needs identified Readmission within the last 30 days:  No Current discharge risk:  None Barriers to Discharge:  Continued Medical Work up, No SNF bed   [name], LCSW 09/29/2018, 2:11 PM

## 2018-09-29 NOTE — Progress Notes (Signed)
PHARMACIST - PHYSICIAN COMMUNICATION DR:   Starla Link CONCERNING: Antibiotic IV to Oral Route Change Policy  RECOMMENDATION: This patient is receiving Azithromycin by the intravenous route.  Based on criteria approved by the Pharmacy and Therapeutics Committee, the antibiotic(s) is/are being converted to the equivalent oral dose form(s).   DESCRIPTION: These criteria include:  Patient being treated for a respiratory tract infection, urinary tract infection, cellulitis or clostridium difficile associated diarrhea if on metronidazole  The patient is not neutropenic and does not exhibit a GI malabsorption state  The patient is eating (either orally or via tube) and/or has been taking other orally administered medications for a least 24 hours  The patient is improving clinically and has a Tmax < 100.5  If you have questions about this conversion, please contact the Pharmacy Department  [x]   [phone])  [facility] PharmD, [place] Pager [phone] 09/29/2018 10:25 AM

## 2018-09-29 NOTE — Progress Notes (Signed)
Patient ID: [name], female   DOB: April 22, 1918, 84 y.o.   MRN: [id number]  PROGRESS NOTE    Kolbi Tofte  DGU:[id number] DOB: 07/27/1918 DOA: 09/27/2018 PCP: Gynecology, [facility]   Brief Narrative:  84 year old female with history of hypertension, hypothyroidism, hyperlipidemia, dementia, gout presented with weakness, fall with fever and chills.  Chest x-ray showed possible pneumonia and interstitial lung disease.  She was started on IV antibiotics and intravenous fluids.   Assessment & Plan:   Principal Problem:   CAP (community acquired pneumonia) Active Problems:   AKI (acute kidney injury) ([place])   Essential hypertension   HLD (hyperlipidemia)   Hypothyroidism   Probable community-acquired bacterial left lower lobe pneumonia -Continue Rocephin and Zithromax.  Cultures negative so far.  Urine streptococcal antigen negative.  Follow urine Legionella antigen.  Flu a and B are negative.  Currently on room air.  Oxygen supplementation as needed -Diet as per SLP recommendations  Probable pulmonary fibrosis with progression -Chest x-ray suggestive of above.  Might need outpatient pulmonary evaluation.  If respiratory status worsens, will get inpatient pulmonary consultation and might need CT of the chest  Leukocytosis -Improving.  Monitor.  Acute renal failure with metabolic acidosis -Probably has some stage of chronic renal disease, unclear which one though.  -Creatinine slightly worse today.  Will switch IV fluids to bicarb drip.  Monitor.  Renal ultrasound was negative for hydronephrosis  Hyperlipidemia -Continue statins  Hypertension -Blood pressure on the higher side.  Monitor.  Continue metoprolol and amlodipine  Hypothyroidism  -Continue Synthroid  Possible dementia -Monitor mental status.  Fall precautions  Generalized conditioning  -PT recommends SNF placement.  Social worker consult -Spoke to son at bedside and recommended that family  should have discussion about CODE STATUS and consider changing her to DNR because of her advanced age and renal function.  DVT prophylaxis: Lovenox Code Status: Full Family Communication: Spoke to one son at bedside who lives in [place]  Disposition Plan: Nursing home in 1 to 2 days once clinically is more stable  Consultants: None Procedures: None  Antimicrobials: Rocephin and Zithromax from 09/27/2018 onwards   Subjective: Patient seen and examined at bedside.  She is lying in bed, awake, poor historian.  Still slightly confused.  No overnight fever or vomiting reported by nursing staff.  Objective: Vitals:   09/28/18 1501 09/28/18 2022 09/29/18 0404 09/29/18 0459  BP: (!) 191/67 (!) 159/94  (!) 162/70  Pulse: 72 77  69  Resp: 16 14  15   Temp: 98.5 F (36.9 C) 97.7 F (36.5 C)  97.7 F (36.5 C)  TempSrc: Oral Oral  Oral  SpO2: 95% 94%  94%  Weight:   61.7 kg   Height:   5' (1.524 m)     Intake/Output Summary (Last 24 hours) at 09/29/2018 0936 Last data filed at 09/29/2018 0517 Gross per 24 hour  Intake 2305.86 ml  Output 350 ml  Net 1955.86 ml   Filed Weights   09/29/18 0404  Weight: 61.7 kg    Examination:  General exam: No distress.  Elderly female lying in bed.  Awake but slightly confused to time. Respiratory system: Bilateral decreased breath sounds at bases, some scattered left basilar crackles.   Cardiovascular system: Rate controlled, S1-S2 heard Gastrointestinal system: Abdomen is nondistended, soft and nontender. Normal bowel sounds heard. Extremities: No cyanosis, edema    Data Reviewed: I have personally reviewed following labs and imaging studies  CBC: Recent Labs  Lab 09/27/18 1824 09/27/18 1836  09/28/18 0540 09/29/18 0748  WBC  --  16.0* 15.8* 12.9*  NEUTROABS  --  13.3*  --  10.3*  HGB 13.3 13.0 11.5* 10.2*  HCT 39.0 41.1 36.6 32.4*  MCV  --  93.2 94.8 96.4  PLT  --  394 331 469   Basic Metabolic Panel: Recent Labs  Lab  09/27/18 1824 09/27/18 1836 09/28/18 0540 09/29/18 0748  NA 140 141 142 142  K 4.1 4.2 3.9 3.6  CL 107 107 112* 114*  CO2  --  22 20* 17*  GLUCOSE 151* 150* 104* 77  BUN 35* 38* 34* 30*  CREATININE 3.00* 2.88* 2.57* 2.62*  CALCIUM  --  9.1 8.6* 7.9*  MG  --   --   --  1.7   GFR: Estimated Creatinine Clearance: 9.8 mL/min (A) (by C-G formula based on SCr of 2.62 mg/dL (H)). Liver Function Tests: Recent Labs  Lab 09/27/18 1836 09/29/18 0748  AST 20 16  ALT 11 10  ALKPHOS 80 63  BILITOT 0.7 0.8  PROT 7.6 5.7*  ALBUMIN 3.4* 2.4*   No results for input(s): LIPASE, AMYLASE in the last 168 hours. Recent Labs  Lab 09/29/18 0748  AMMONIA 18   Coagulation Profile: No results for input(s): INR, PROTIME in the last 168 hours. Cardiac Enzymes: No results for input(s): CKTOTAL, CKMB, CKMBINDEX, TROPONINI in the last 168 hours. BNP (last 3 results) No results for input(s): PROBNP in the last 8760 hours. HbA1C: No results for input(s): HGBA1C in the last 72 hours. CBG: No results for input(s): GLUCAP in the last 168 hours. Lipid Profile: No results for input(s): CHOL, HDL, LDLCALC, TRIG, CHOLHDL, LDLDIRECT in the last 72 hours. Thyroid Function Tests: Recent Labs    09/29/18 0748  TSH 2.613   Anemia Panel: Recent Labs    09/29/18 0748  VITAMINB12 853   Sepsis Labs: Recent Labs  Lab 09/27/18 1824  LATICACIDVEN 1.44    Recent Results (from the past 240 hour(s))  Blood Culture (routine x 2)     Status: None (Preliminary result)   Collection Time: 09/27/18  6:36 PM  Result Value Ref Range Status   Specimen Description   Final    LEFT ANTECUBITAL Performed at [facility] [address], [place], [place] [address]    Special Requests   Final    BOTTLES DRAWN AEROBIC AND ANAEROBIC Blood Culture adequate volume Performed at [facility] [address], [place], [place] [address]    Culture   Final    NO GROWTH 1  DAY Performed at [facility], [place] [address] [place], [place] [address]    Report Status PENDING  Incomplete  Blood Culture (routine x 2)     Status: None (Preliminary result)   Collection Time: 09/27/18  6:36 PM  Result Value Ref Range Status   Specimen Description   Final    BLOOD LEFT HAND Performed at [facility] [address] [place], [place] [address]    Special Requests   Final    BOTTLES DRAWN AEROBIC AND ANAEROBIC Blood Culture adequate volume Performed at [facility] [address] [place], [place] [address]    Culture   Final    NO GROWTH 1 DAY Performed at [facility], [place] [address] [place], [place] [address]    Report Status PENDING  Incomplete         Radiology Studies: Dg Chest 2 View  Result Date: 09/27/2018 CLINICAL DATA:  Fever EXAM: CHEST - 2 VIEW COMPARISON:  01/28/2009 chest radiograph. FINDINGS: Normal heart size. Mildly tortuous atherosclerotic thoracic aorta. Moderate hiatal hernia. No pneumothorax. No pleural effusion. Patchy reticular opacities at the peripheral left lung base, increased from prior. No pulmonary edema. IMPRESSION: 1. Chronic patchy reticular opacities at the peripheral left lung base, increased from 2010 radiograph, favor progression of pulmonary fibrosis, can not exclude acute superimposed pneumonia. Short-term follow-up PA and lateral chest radiographs suggested. 2. Moderate hiatal hernia. Electronically Signed   By: Ilona Sorrel M.D.   On: 09/27/2018 19:46   Dg Pelvis 1-2 Views  Result Date: 09/27/2018 CLINICAL DATA:  Urinary frequency and weakness with fever. Pelvic pain. EXAM: PELVIS - 1-2 VIEW COMPARISON:  None. FINDINGS: Lumbar spondylosis with disc space narrowing of the included lumbar spine from L4 through S1 is identified. Moderate stool retention is seen within the cecum and ascending colon partially obscuring the right iliac bone. Likewise, the sacrum is  suboptimally assessed due to overlying bowel. No acute displaced appearing fracture. No joint dislocation. No suspicious radiopaque calculi. Phlebolith is noted in the right lower pelvis. IMPRESSION: Lumbar spondylosis. No acute osseous abnormality. Electronically Signed   By: Ashley Royalty M.D.   On: 09/27/2018 19:48   US Renal  Result Date: 09/28/2018 CLINICAL DATA:  Acute kidney injury history of hypertension. EXAM: RENAL / URINARY TRACT ULTRASOUND COMPLETE COMPARISON:  None. FINDINGS: Right Kidney: Renal measurements: 9.8 x 5.7 x 6.6 cm = volume: 192 mL. The renal cortical echotexture remains lower than that of the adjacent liver. There is a parapelvic cyst versus mildly dilated renal pelvis measuring 3.8 x 2.4 x 2.8 cm. Left Kidney: Renal measurements: 8.0 x 3.7 x 4.3 cm = volume: 66.9 mL. Echogenicity within normal limits. There is a dominant lower pole cyst measuring 3.6 x 2.9 x 2.8 cm. A small exophytic upper pole cyst measures 1 cm in diameter. Bladder: The urinary bladder was not visualized. IMPRESSION: No hydronephrosis. Bilateral cortical cysts. No significant cortical echotexture abnormalities. Nonvisualization of the urinary bladder due to bowel gas. Electronically Signed   By: David  Martinique M.D.   On: 09/28/2018 12:55        Scheduled Meds: . amLODipine  5 mg Oral Daily  . enoxaparin (LOVENOX) injection  30 mg Subcutaneous Daily  . febuxostat  40 mg Oral Daily  . levothyroxine  25 mcg Oral QAC breakfast  . metoprolol succinate  100 mg Oral Daily  . rosuvastatin  2.5 mg Oral q1800   Continuous Infusions: . sodium chloride 100 mL/hr at 09/29/18 0402  . azithromycin Stopped (09/28/18 1959)  . cefTRIAXone (ROCEPHIN)  IV Stopped (09/28/18 1832)     LOS: 2 days        Aline August, MD [facility] Pager [phone]  If 7PM-7AM, please contact night-coverage [email or web address] Password [facility] 09/29/2018, 9:36 AM

## 2018-09-30 ENCOUNTER — Inpatient Hospital Stay (HOSPITAL_COMMUNITY): Payer: Medicare Other

## 2018-09-30 DIAGNOSIS — N179 Acute kidney failure, unspecified: Secondary | ICD-10-CM

## 2018-09-30 DIAGNOSIS — I10 Essential (primary) hypertension: Secondary | ICD-10-CM

## 2018-09-30 LAB — CBC WITH DIFFERENTIAL/PLATELET
Abs Immature Granulocytes: 0.07 10*3/uL (ref 0.00–0.07)
Basophils Absolute: 0 10*3/uL (ref 0.0–0.1)
Basophils Relative: 0 %
Eosinophils Absolute: 0.2 10*3/uL (ref 0.0–0.5)
Eosinophils Relative: 1 %
HEMATOCRIT: 33.6 % — AB (ref 36.0–46.0)
Hemoglobin: 10.6 g/dL — ABNORMAL LOW (ref 12.0–15.0)
Immature Granulocytes: 1 %
LYMPHS ABS: 1.1 10*3/uL (ref 0.7–4.0)
Lymphocytes Relative: 9 %
MCH: 29.4 pg (ref 26.0–34.0)
MCHC: 31.5 g/dL (ref 30.0–36.0)
MCV: 93.1 fL (ref 80.0–100.0)
Monocytes Absolute: 1.7 10*3/uL — ABNORMAL HIGH (ref 0.1–1.0)
Monocytes Relative: 13 %
Neutro Abs: 9.5 10*3/uL — ABNORMAL HIGH (ref 1.7–7.7)
Neutrophils Relative %: 76 %
Platelets: 374 10*3/uL (ref 150–400)
RBC: 3.61 MIL/uL — ABNORMAL LOW (ref 3.87–5.11)
RDW: 13.8 % (ref 11.5–15.5)
WBC: 12.6 10*3/uL — ABNORMAL HIGH (ref 4.0–10.5)
nRBC: 0 % (ref 0.0–0.2)

## 2018-09-30 LAB — BASIC METABOLIC PANEL
Anion gap: 10 (ref 5–15)
BUN: 26 mg/dL — ABNORMAL HIGH (ref 8–23)
CO2: 23 mmol/L (ref 22–32)
Calcium: 8.1 mg/dL — ABNORMAL LOW (ref 8.9–10.3)
Chloride: 108 mmol/L (ref 98–111)
Creatinine, Ser: 2.69 mg/dL — ABNORMAL HIGH (ref 0.44–1.00)
GFR calc Af Amer: 16 mL/min — ABNORMAL LOW (ref >60)
GFR calc non Af Amer: 14 mL/min — ABNORMAL LOW (ref >60)
Glucose, Bld: 136 mg/dL — ABNORMAL HIGH (ref 70–99)
Potassium: 3.1 mmol/L — ABNORMAL LOW (ref 3.5–5.1)
Sodium: 141 mmol/L (ref 135–145)

## 2018-09-30 LAB — MAGNESIUM: Magnesium: 1.7 mg/dL (ref 1.7–2.4)

## 2018-09-30 MED ORDER — IPRATROPIUM-ALBUTEROL 0.5-2.5 (3) MG/3ML IN SOLN
3.0000 mL | RESPIRATORY_TRACT | Status: DC | PRN
  Administered 2018-09-30: 3 mL via RESPIRATORY_TRACT

## 2018-09-30 MED ORDER — POTASSIUM CHLORIDE CRYS ER 20 MEQ PO TBCR
40.0000 meq | EXTENDED_RELEASE_TABLET | Freq: Once | ORAL | Status: AC
  Administered 2018-09-30: 40 meq via ORAL
  Filled 2018-09-30: qty 2

## 2018-09-30 MED ORDER — FUROSEMIDE 10 MG/ML IJ SOLN
INTRAMUSCULAR | Status: AC
  Administered 2018-09-30: 40 mg via INTRAVENOUS
  Filled 2018-09-30: qty 4

## 2018-09-30 MED ORDER — IPRATROPIUM-ALBUTEROL 0.5-2.5 (3) MG/3ML IN SOLN: 3.0000 mL | RESPIRATORY_TRACT | Status: DC | PRN

## 2018-09-30 MED ORDER — METHYLPREDNISOLONE SODIUM SUCC 40 MG IJ SOLR
INTRAMUSCULAR | Status: AC
  Filled 2018-09-30: qty 1

## 2018-09-30 MED ORDER — FUROSEMIDE 10 MG/ML IJ SOLN
40.0000 mg | Freq: Once | INTRAMUSCULAR | Status: AC
  Administered 2018-09-30: 40 mg via INTRAVENOUS

## 2018-09-30 MED ORDER — IPRATROPIUM-ALBUTEROL 0.5-2.5 (3) MG/3ML IN SOLN: 3.0000 mL | Freq: Four times a day (QID) | RESPIRATORY_TRACT | Status: DC

## 2018-09-30 MED ORDER — IPRATROPIUM-ALBUTEROL 0.5-2.5 (3) MG/3ML IN SOLN
RESPIRATORY_TRACT | Status: AC
  Administered 2018-09-30: 3 mL via RESPIRATORY_TRACT
  Filled 2018-09-30: qty 3

## 2018-09-30 MED ORDER — METHYLPREDNISOLONE SODIUM SUCC 40 MG IJ SOLR: 40.0000 mg | INTRAMUSCULAR | Status: DC

## 2018-09-30 NOTE — Progress Notes (Signed)
PROGRESS NOTE    [name]  PPI:[id number] DOB: 13-Dec-1917 DOA: 09/27/2018 PCP: [facility], [facility]    Brief Narrative:  85 year old female with history of hypertension, hypothyroidism, hyperlipidemia, dementia, gout presented with weakness, fall with fever and chills.  Chest x-ray showed possible pneumonia and interstitial lung disease.  She was started on IV antibiotics and intravenous fluids.  Assessment & Plan:   Principal Problem:   CAP (community acquired pneumonia) Active Problems:   AKI (acute kidney injury) ([place])   Essential hypertension   HLD (hyperlipidemia)   Hypothyroidism  Probable community-acquired bacterial left lower lobe pneumonia -Continue Rocephin and Zithromax.  Cultures negative so far.  Urine streptococcal antigen negative.  Flu a and B are negative.  Currently on room air.  Oxygen supplementation as needed -Follow up CXR ordered and reviewed. Vascular congestion with B pleural effusions L>R. Will hold further IVF and give lasix x 1 dose  Probable pulmonary fibrosis with progression -Chest x-ray suggestive of above.  Might need outpatient pulmonary evaluation.  Leukocytosis -Improving.   -Repeat cbc in AM  Acute renal failure with metabolic acidosis -Probably has some stage of chronic renal disease, unclear which one though.  -Renal ultrasound was negative for hydronephrosis -Renal function stable  Hyperlipidemia -Continue statins as tolerated  Hypertension -Blood pressure on the higher side.  Monitor.  Continue metoprolol and amlodipine -BP stable at present  Hypothyroidism  -Continue Synthroid as tolerated  Possible dementia -Monitor mental status.  Fall precautions  Generalized conditioning  -PT recommends SNF placement.  Social worker consulted   DVT prophylaxis: Lovenox subQ Code Status: Full Family Communication: Pt in room, family at bedside Disposition Plan: Uncertain at this time  Consultants:       Procedures:     Antimicrobials: Anti-infectives (From admission, onward)   Start     Dose/Rate Route Frequency Ordered Stop   09/29/18 1800  azithromycin (ZITHROMAX) tablet 500 mg     500 mg Oral Every 24 hours 09/29/18 1026     09/28/18 1800  azithromycin (ZITHROMAX) 500 mg in sodium chloride 0.9 % 250 mL IVPB  Status:  Discontinued     500 mg 250 mL/hr over 60 Minutes Intravenous Every 24 hours 09/27/18 2202 09/29/18 1026   09/27/18 2000  azithromycin (ZITHROMAX) 500 mg in sodium chloride 0.9 % 250 mL IVPB     500 mg 250 mL/hr over 60 Minutes Intravenous  Once 09/27/18 1958 09/27/18 2134   09/27/18 1800  cefTRIAXone (ROCEPHIN) 1 g in sodium chloride 0.9 % 100 mL IVPB     1 g 200 mL/hr over 30 Minutes Intravenous Every 24 hours 09/27/18 1751         Subjective: Complaining of increased mucus  Objective: Vitals:   09/29/18 2023 09/29/18 2058 09/30/18 0516 09/30/18 1424  BP: (!) 141/95 (!) 191/77 (!) 126/96 (!) 177/75  Pulse:  75 83 81  Resp:  (!) 25 (!) 25 (!) 31  Temp:  99.8 F (37.7 C) 100.1 F (37.8 C) 99.2 F (37.3 C)  TempSrc:  Oral Oral Oral  SpO2:  93% 92% 93%  Weight:      Height:        Intake/Output Summary (Last 24 hours) at 09/30/2018 1726 Last data filed at 09/30/2018 0700 Gross per 24 hour  Intake 360 ml  Output 700 ml  Net -340 ml   Filed Weights   09/29/18 0404  Weight: 61.7 kg    Examination:  General exam: Appears calm and comfortable  Respiratory  system: Clear to auscultation. Increased resp effort Cardiovascular system: S1 & S2 heard, RRR. Gastrointestinal system: Abdomen is nondistended, soft and nontender. No organomegaly or masses felt. Normal bowel sounds heard. Central nervous system: Alert and oriented. No focal neurological deficits. Extremities: Symmetric 5 x 5 power. Skin: No rashes, lesions  Psychiatry: Judgement and insight appear normal. Mood & affect appropriate.   Data Reviewed: I have personally reviewed following  labs and imaging studies  CBC: Recent Labs  Lab 09/27/18 1824 09/27/18 1836 09/28/18 0540 09/29/18 0748 09/30/18 0621  WBC  --  16.0* 15.8* 12.9* 12.6*  NEUTROABS  --  13.3*  --  10.3* 9.5*  HGB 13.3 13.0 11.5* 10.2* 10.6*  HCT 39.0 41.1 36.6 32.4* 33.6*  MCV  --  93.2 94.8 96.4 93.1  PLT  --  394 331 307 355   Basic Metabolic Panel: Recent Labs  Lab 09/27/18 1824 09/27/18 1836 09/28/18 0540 09/29/18 0748 09/30/18 0621  NA 140 141 142 142 141  K 4.1 4.2 3.9 3.6 3.1*  CL 107 107 112* 114* 108  CO2  --  22 20* 17* 23  GLUCOSE 151* 150* 104* 77 136*  BUN 35* 38* 34* 30* 26*  CREATININE 3.00* 2.88* 2.57* 2.62* 2.69*  CALCIUM  --  9.1 8.6* 7.9* 8.1*  MG  --   --   --  1.7 1.7   GFR: Estimated Creatinine Clearance: 9.6 mL/min (A) (by C-G formula based on SCr of 2.69 mg/dL (H)). Liver Function Tests: Recent Labs  Lab 09/27/18 1836 09/29/18 0748  AST 20 16  ALT 11 10  ALKPHOS 80 63  BILITOT 0.7 0.8  PROT 7.6 5.7*  ALBUMIN 3.4* 2.4*   No results for input(s): LIPASE, AMYLASE in the last 168 hours. Recent Labs  Lab 09/29/18 0748  AMMONIA 18   Coagulation Profile: No results for input(s): INR, PROTIME in the last 168 hours. Cardiac Enzymes: No results for input(s): CKTOTAL, CKMB, CKMBINDEX, TROPONINI in the last 168 hours. BNP (last 3 results) No results for input(s): PROBNP in the last 8760 hours. HbA1C: No results for input(s): HGBA1C in the last 72 hours. CBG: No results for input(s): GLUCAP in the last 168 hours. Lipid Profile: No results for input(s): CHOL, HDL, LDLCALC, TRIG, CHOLHDL, LDLDIRECT in the last 72 hours. Thyroid Function Tests: Recent Labs    09/29/18 0748  TSH 2.613   Anemia Panel: Recent Labs    09/29/18 0748  VITAMINB12 853   Sepsis Labs: Recent Labs  Lab 09/27/18 1824  LATICACIDVEN 1.44    Recent Results (from the past 240 hour(s))  Urine culture     Status: None   Collection Time: 09/27/18  5:51 PM  Result Value Ref  Range Status   Specimen Description   Final    URINE, RANDOM Performed at [facility] [address], [place], [place] [address]    Special Requests   Final    NONE Performed at [facility], [place] [address], [place], [place] [address]    Culture   Final    NO GROWTH Performed at [facility], [place] [address] [place], [place] [address]    Report Status 09/29/2018 FINAL  Final  Blood Culture (routine x 2)     Status: None (Preliminary result)   Collection Time: 09/27/18  6:36 PM  Result Value Ref Range Status   Specimen Description   Final    LEFT ANTECUBITAL Performed at [facility], [address] [place], [place] [address]    Special Requests   Final    BOTTLES DRAWN AEROBIC AND ANAEROBIC Blood Culture adequate volume Performed at [facility] [address] [place], [place] [address]    Culture   Final    NO GROWTH 2 DAYS Performed at [facility] [address] [place], [place] [address]    Report Status PENDING  Incomplete  Blood Culture (routine x 2)     Status: None (Preliminary result)   Collection Time: 09/27/18  6:36 PM  Result Value Ref Range Status   Specimen Description   Final    BLOOD LEFT HAND Performed at [facility] [address] [place], [place] [address]    Special Requests   Final    BOTTLES DRAWN AEROBIC AND ANAEROBIC Blood Culture adequate volume Performed at [facility] [address] [place], [place] [address]    Culture   Final    NO GROWTH 2 DAYS Performed at [facility] [address], [place], [place] [address]    Report Status PENDING  Incomplete     Radiology Studies: Dg Chest Port 1 View  Result Date: 09/30/2018 CLINICAL DATA:  Cough EXAM: PORTABLE CHEST 1 VIEW COMPARISON:  09/27/2018 FINDINGS: Development of small left greater than right pleural effusions. Increased left greater than right  basilar airspace disease. Cardiomegaly with vascular congestion. Aortic atherosclerosis. No pneumothorax. IMPRESSION: 1. Cardiomegaly with central vascular congestion 2. Development of small bilateral pleural effusions left greater than right as well as left greater than right basilar airspace disease which may reflect atelectasis or pneumonia Electronically Signed   By: Donavan Foil M.D.   On: 09/30/2018 15:59    Scheduled Meds: . amLODipine  5 mg Oral Daily  . azithromycin  500 mg Oral Q24H  . enoxaparin (LOVENOX) injection  30 mg Subcutaneous Daily  . febuxostat  40 mg Oral Daily  . levothyroxine  25 mcg Oral QAC breakfast  . methylPREDNISolone sodium succinate      . metoprolol succinate  100 mg Oral Daily  . rosuvastatin  2.5 mg Oral q1800   Continuous Infusions: . cefTRIAXone (ROCEPHIN)  IV 1 g (09/30/18 1700)  .  sodium bicarbonate  infusion 1000 mL 75 mL/hr at 09/29/18 1019     LOS: 3 days   Marylu Lund, MD [facility] Pager On Amion  If 7PM-7AM, please contact night-coverage 09/30/2018, 5:26 PM

## 2018-09-30 NOTE — Progress Notes (Signed)
   09/30/18 1424  MEWS Score  Resp (!) 31  Pulse Rate 81  BP (!) 177/75  Temp 99.2 F (37.3 C)  SpO2 93 %  O2 Device Room Air  MEWS Score  MEWS RR 2  MEWS Pulse 0  MEWS Systolic 0  MEWS LOC 0  MEWS Temp 0  MEWS Score 2  MEWS Score Color Yellow  MEWS Assessment  Is this an acute change? No    Chiu MD on the floor and stepped in to assess pt. MD placed new orders which were carried out per mar

## 2018-09-30 NOTE — Clinical Social Work Placement (Signed)
   Patient has bed at [facility] confirmed bed with facility.   LCSW faxed dc docs to facility.   Patient will transport by [facility].   RN report number: [phone]  [facility]  NOTE  Date:  09/30/2018  Patient Details  Name: [name] MRN: [id number] Date of Birth: [birth year]  Clinical Social Work is seeking post-discharge placement for this patient at the [place] level of care (*CSW will initial, date and re-position this form in  chart as items are completed):  Yes   Patient/family provided with [facility] Work Department's list of facilities offering this level of care within the geographic area requested by the patient (or if unable, by the patient's family).  Yes   Patient/family informed of their freedom to choose among providers that offer the needed level of care, that participate in Medicare, Medicaid or managed care program needed by the patient, have an available bed and are willing to accept the patient.  Yes   Patient/family informed of [facility]'s ownership interest in [facility] and [facility], as well as of the fact that they are under no obligation to receive care at these facilities.  PASRR submitted to EDS on       PASRR number received on 09/29/18     Existing PASRR number confirmed on       FL2 transmitted to all facilities in geographic area requested by pt/family on 09/29/18     FL2 transmitted to all facilities within larger geographic area on       Patient informed that his/her managed care company has contracts with or will negotiate with certain facilities, including the following:        Yes   Patient/family informed of bed offers received.  Patient chooses bed at [facility]     Physician recommends and patient chooses bed at      Patient to be transferred to [facility] on 10/06/2018.  Patient to be transferred to  facility by EMS     Patient family notified on 10/06/18 of transfer.  Name of family member notified:  JAck     PHYSICIAN       Additional Comment:    _______________________________________________ [name], LCSW 09/30/2018, 11:15 AM

## 2018-09-30 NOTE — Progress Notes (Signed)
LCSW following for SNF placement.   Per family request LCSW emailed list of bed offers to son, [name].   LCSW awaiting bed choice.   Plan: SNF at dc.   Carolin Coy [facility] [place]

## 2018-09-30 NOTE — Progress Notes (Signed)
LCSW following for SNF placement.   Patient has bed at [facility] and can transport to facility in the morning.   LCSW met with patient and son Jack. Family had concerns about patients tricare insurance being added to her account. LCSW found a scanned copy of the tricare card from 2012. Patients birth year is wrong on card, listed as 1920 vs 1919. LCSW explained that this could be the barrier to locating the insurance when trying to verify. Family will follow up with medical records and admissions on their own to get insurance worked out.   LCSW will continue to follow for dc needs.    , LSCW [place] CSW [phone]  

## 2018-09-30 NOTE — Progress Notes (Signed)
Pt instructed on use of Flutter valve.  Pt demonstrated with fair effort and technique.  Pt is very weak, RT to monitor and assess as needed.

## 2018-10-01 DIAGNOSIS — J181 Lobar pneumonia, unspecified organism: Secondary | ICD-10-CM

## 2018-10-01 LAB — BASIC METABOLIC PANEL
Anion gap: 13 (ref 5–15)
BUN: 25 mg/dL — ABNORMAL HIGH (ref 8–23)
CO2: 25 mmol/L (ref 22–32)
Calcium: 8.1 mg/dL — ABNORMAL LOW (ref 8.9–10.3)
Chloride: 105 mmol/L (ref 98–111)
Creatinine, Ser: 2.83 mg/dL — ABNORMAL HIGH (ref 0.44–1.00)
GFR calc non Af Amer: 13 mL/min — ABNORMAL LOW (ref >60)
GFR, EST AFRICAN AMERICAN: 15 mL/min — AB (ref >60)
Glucose, Bld: 109 mg/dL — ABNORMAL HIGH (ref 70–99)
Potassium: 2.9 mmol/L — ABNORMAL LOW (ref 3.5–5.1)
Sodium: 143 mmol/L (ref 135–145)

## 2018-10-01 LAB — FOLATE RBC
Folate, Hemolysate: >620 ng/mL
Folate, RBC: >2116 ng/mL (ref >498)
Hematocrit: 29.3 % — ABNORMAL LOW (ref 34.0–46.6)

## 2018-10-01 MED ORDER — AMLODIPINE BESYLATE 10 MG PO TABS
10.0000 mg | ORAL_TABLET | Freq: Every day | ORAL | Status: DC
  Administered 2018-10-02 – 2018-10-06 (×5): 10 mg via ORAL
  Filled 2018-10-01 (×5): qty 1

## 2018-10-01 MED ORDER — HYDRALAZINE HCL 10 MG PO TABS
10.0000 mg | ORAL_TABLET | Freq: Three times a day (TID) | ORAL | Status: DC
  Administered 2018-10-02 – 2018-10-06 (×13): 10 mg via ORAL
  Filled 2018-10-01 (×14): qty 1

## 2018-10-01 MED ORDER — FUROSEMIDE 10 MG/ML IJ SOLN
40.0000 mg | Freq: Once | INTRAMUSCULAR | Status: AC
  Administered 2018-10-01: 40 mg via INTRAVENOUS
  Filled 2018-10-01: qty 4

## 2018-10-01 MED ORDER — AMLODIPINE BESYLATE 5 MG PO TABS
5.0000 mg | ORAL_TABLET | ORAL | Status: AC
  Administered 2018-10-01: 5 mg via ORAL
  Filled 2018-10-01: qty 1

## 2018-10-01 MED ORDER — HYDRALAZINE HCL 20 MG/ML IJ SOLN
5.0000 mg | Freq: Once | INTRAMUSCULAR | Status: AC
  Administered 2018-10-01: 5 mg via INTRAVENOUS

## 2018-10-01 NOTE — Progress Notes (Addendum)
PROGRESS NOTE    Lesly Pontarelli  RXV:[id number] DOB: 1918-08-24 DOA: 09/27/2018 PCP: [facility]    Brief Narrative:  85 year old female with history of hypertension, hypothyroidism, hyperlipidemia, dementia, gout presented with weakness, fall with fever and chills.  Chest x-ray showed possible pneumonia and interstitial lung disease.  She was started on IV antibiotics and intravenous fluids.  Assessment & Plan:   Principal Problem:   CAP (community acquired pneumonia) Active Problems:   AKI (acute kidney injury) ([place])   Essential hypertension   HLD (hyperlipidemia)   Hypothyroidism  Probable community-acquired bacterial left lower lobe pneumonia -Continue Rocephin and Zithromax.  Cultures negative so far.  Urine streptococcal antigen negative.  Flu a and B are negative.  Currently on room air.  Oxygen supplementation as needed -Follow up CXR ordered and reviewed. Vascular congestion with B pleural effusions L>R. See below. Held IVF and now on lasix  Probable pulmonary fibrosis with progression -Chest x-ray suggestive of above.  Might need outpatient pulmonary evaluation.  Leukocytosis -Improving.   -Recheck CBC in AM  Acute renal failure with metabolic acidosis -Probably has some stage of chronic renal disease, unclear which one though.  -Renal ultrasound was negative for hydronephrosis -Renal function presently stable  Hyperlipidemia -Continue with statin as tolerated  Hypertension -Blood pressure on the higher side.  Monitor.   -Will continue with metoprolol and amlodipine as tolerated -BP elevated, requiring hydralazine -will increase norvasc to 10mg  and start scheduled PO hydralazine  Hypothyroidism  -Continue Synthroid as tolerated  Possible dementia -Monitor mental status.   -Appear stable at present  Generalized conditioning  -PT recommends SNF placement.  Social worker consulted   DVT prophylaxis: Lovenox subQ Code  Status: Full Family Communication: Pt in room, family at bedside Disposition Plan: Uncertain at this time  Consultants:     Procedures:     Antimicrobials: Anti-infectives (From admission, onward)   Start     Dose/Rate Route Frequency Ordered Stop   09/29/18 1800  azithromycin (ZITHROMAX) tablet 500 mg     500 mg Oral Every 24 hours 09/29/18 1026     09/28/18 1800  azithromycin (ZITHROMAX) 500 mg in sodium chloride 0.9 % 250 mL IVPB  Status:  Discontinued     500 mg 250 mL/hr over 60 Minutes Intravenous Every 24 hours 09/27/18 2202 09/29/18 1026   09/27/18 2000  azithromycin (ZITHROMAX) 500 mg in sodium chloride 0.9 % 250 mL IVPB     500 mg 250 mL/hr over 60 Minutes Intravenous  Once 09/27/18 1958 09/27/18 2134   09/27/18 1800  cefTRIAXone (ROCEPHIN) 1 g in sodium chloride 0.9 % 100 mL IVPB     1 g 200 mL/hr over 30 Minutes Intravenous Every 24 hours 09/27/18 1751        Subjective: Complained of sob overnight  Objective: Vitals:   10/01/18 0504 10/01/18 0511 10/01/18 1416 10/01/18 1500  BP: (!) 174/94  (!) 178/142 (!) 198/69  Pulse: 74  97   Resp: 20  (!) 22   Temp: 99.2 F (37.3 C)  99.6 F (37.6 C)   TempSrc:      SpO2: (!) 87% 94% 96%   Weight:      Height:        Intake/Output Summary (Last 24 hours) at 10/01/2018 1636 Last data filed at 10/01/2018 1201 Gross per 24 hour  Intake -  Output 2350 ml  Net -2350 ml   Filed Weights   09/29/18 0404 09/30/18 1700  Weight: 61.7 kg 62.2  kg    Examination: General exam: Awake, laying in bed, in nad Respiratory system: Increased respiratory effort, no wheezing Cardiovascular system: regular rate, s1, s2 Gastrointestinal system: Soft, nondistended, positive BS Central nervous system: CN2-12 grossly intact, strength intact Extremities: Perfused, no clubbing Skin: Normal skin turgor, no notable skin lesions seen Psychiatry: Mood normal // no visual hallucinations   Data Reviewed: I have personally reviewed  following labs and imaging studies  CBC: Recent Labs  Lab 09/27/18 1824 09/27/18 1836 09/28/18 0540 09/29/18 0748 09/30/18 0621  WBC  --  16.0* 15.8* 12.9* 12.6*  NEUTROABS  --  13.3*  --  10.3* 9.5*  HGB 13.3 13.0 11.5* 10.2* 10.6*  HCT 39.0 41.1 36.6 32.4*  29.3* 33.6*  MCV  --  93.2 94.8 96.4 93.1  PLT  --  394 331 307 656   Basic Metabolic Panel: Recent Labs  Lab 09/27/18 1836 09/28/18 0540 09/29/18 0748 09/30/18 0621 10/01/18 0614  NA 141 142 142 141 143  K 4.2 3.9 3.6 3.1* 2.9*  CL 107 112* 114* 108 105  CO2 22 20* 17* 23 25  GLUCOSE 150* 104* 77 136* 109*  BUN 38* 34* 30* 26* 25*  CREATININE 2.88* 2.57* 2.62* 2.69* 2.83*  CALCIUM 9.1 8.6* 7.9* 8.1* 8.1*  MG  --   --  1.7 1.7  --    GFR: Estimated Creatinine Clearance: 9.1 mL/min (A) (by C-G formula based on SCr of 2.83 mg/dL (H)). Liver Function Tests: Recent Labs  Lab 09/27/18 1836 09/29/18 0748  AST 20 16  ALT 11 10  ALKPHOS 80 63  BILITOT 0.7 0.8  PROT 7.6 5.7*  ALBUMIN 3.4* 2.4*   No results for input(s): LIPASE, AMYLASE in the last 168 hours. Recent Labs  Lab 09/29/18 0748  AMMONIA 18   Coagulation Profile: No results for input(s): INR, PROTIME in the last 168 hours. Cardiac Enzymes: No results for input(s): CKTOTAL, CKMB, CKMBINDEX, TROPONINI in the last 168 hours. BNP (last 3 results) No results for input(s): PROBNP in the last 8760 hours. HbA1C: No results for input(s): HGBA1C in the last 72 hours. CBG: No results for input(s): GLUCAP in the last 168 hours. Lipid Profile: No results for input(s): CHOL, HDL, LDLCALC, TRIG, CHOLHDL, LDLDIRECT in the last 72 hours. Thyroid Function Tests: Recent Labs    09/29/18 0748  TSH 2.613   Anemia Panel: Recent Labs    09/29/18 0748  VITAMINB12 853   Sepsis Labs: Recent Labs  Lab 09/27/18 1824  LATICACIDVEN 1.44    Recent Results (from the past 240 hour(s))  Urine culture     Status: None   Collection Time: 09/27/18  5:51 PM    Result Value Ref Range Status   Specimen Description   Final    URINE, RANDOM Performed at [facility] [address] [place], [place] [address]    Special Requests   Final    NONE Performed at [facility], [place] [address] [place], [place] [address]    Culture   Final    NO GROWTH Performed at [facility], [place] [address], [place], [place] [address]    Report Status 09/29/2018 FINAL  Final  Blood Culture (routine x 2)     Status: None (Preliminary result)   Collection Time: 09/27/18  6:36 PM  Result Value Ref Range Status   Specimen Description   Final    LEFT ANTECUBITAL Performed at [facility] [address]  [place], [place] [address]    Special Requests   Final    BOTTLES DRAWN AEROBIC AND ANAEROBIC Blood Culture adequate volume Performed at [facility] [address] [place], [place] [address]    Culture   Final    NO GROWTH 3 DAYS Performed at [facility], [place] [address] [place], [place] [address]    Report Status PENDING  Incomplete  Blood Culture (routine x 2)     Status: None (Preliminary result)   Collection Time: 09/27/18  6:36 PM  Result Value Ref Range Status   Specimen Description   Final    BLOOD LEFT HAND Performed at [facility] [address] [place], [place] [address]    Special Requests   Final    BOTTLES DRAWN AEROBIC AND ANAEROBIC Blood Culture adequate volume Performed at [facility] [address], [place], [place] [address]    Culture   Final    NO GROWTH 3 DAYS Performed at [facility], [place] [address], [place], [place] [address]    Report Status PENDING  Incomplete     Radiology Studies: Dg Chest Port 1 View  Result Date: 09/30/2018 CLINICAL DATA:  Cough EXAM: PORTABLE CHEST 1 VIEW COMPARISON:  09/27/2018 FINDINGS: Development of small left greater than right pleural effusions. Increased left  greater than right basilar airspace disease. Cardiomegaly with vascular congestion. Aortic atherosclerosis. No pneumothorax. IMPRESSION: 1. Cardiomegaly with central vascular congestion 2. Development of small bilateral pleural effusions left greater than right as well as left greater than right basilar airspace disease which may reflect atelectasis or pneumonia Electronically Signed   By: Donavan Foil M.D.   On: 09/30/2018 15:59    Scheduled Meds: . [START ON 10/02/2018] amLODipine  10 mg Oral Daily  . amLODipine  5 mg Oral NOW  . azithromycin  500 mg Oral Q24H  . enoxaparin (LOVENOX) injection  30 mg Subcutaneous Daily  . febuxostat  40 mg Oral Daily  . hydrALAZINE  10 mg Oral Q8H  . levothyroxine  25 mcg Oral QAC breakfast  . metoprolol succinate  100 mg Oral Daily  . rosuvastatin  2.5 mg Oral q1800   Continuous Infusions: . cefTRIAXone (ROCEPHIN)  IV 1 g (09/30/18 1700)     LOS: 4 days   Marylu Lund, MD [facility] Pager On Amion  If 7PM-7AM, please contact night-coverage 10/01/2018, 4:36 PM

## 2018-10-01 NOTE — Progress Notes (Signed)
Physical Therapy Treatment Patient Details Name: [name] MRN: [id number] DOB: 04-24-1918 Today's Date: 10/01/2018    History of Present Illness  85 y.o. female with history of hypertension, hypothyroidism, hyperlipidemia, gout was brought to the ER 09/27/18 with  weakness, fall at home, cough, fever, dysuria.  Chest x-ray shows possible pneumonia and interstitial lung disease    PT Comments    Pt was agreeable to work with PT. She remains confused. She required Mod-Max assist for mobility on today. O2 sat level dropped to 83% on RA during session; replaced [place] O2 and sats returned to 92%.  Continue to recommend SNF.    Follow Up Recommendations  SNF     Equipment Recommendations  None recommended by PT    Recommendations for Other Services       Precautions / Restrictions Precautions Precautions: Fall Precaution Comments: very thin skin, right leg skin tears Restrictions Weight Bearing Restrictions: No    Mobility  Bed Mobility Overal bed mobility: Needs Assistance Bed Mobility: Supine to Sit;Sit to Supine     Supine to sit: Mod assist;HOB elevated Sit to supine: Mod assist;HOB elevated   General bed mobility comments: assist  with trunk and legs, extra time  Transfers Overall transfer level: Needs assistance Equipment used: Rolling walker (2 wheeled) Transfers: Sit to/from Stand Sit to Stand: Max assist         General transfer comment: Assist to rise, stabilize, control descent. Attempted stand pivot but pt was unable to safely complete task with +1 assist on today.   Ambulation/Gait             General Gait Details: NT-pt unable   Stairs             Wheelchair Mobility    Modified Rankin (Stroke Patients Only)       Balance Overall balance assessment: Needs assistance;History of Falls Sitting-balance support: Feet supported Sitting balance-Leahy Scale: Fair     Standing balance support: Bilateral upper extremity  supported Standing balance-Leahy Scale: Poor                              Cognition   Behavior During Therapy: WFL for tasks assessed/performed Overall Cognitive Status: Impaired/Different from baseline Area of Impairment: Orientation;Safety/judgement                 Orientation Level: Place;Time;Situation;Disoriented to   Memory: Decreased recall of precautions;Decreased short-term memory   Safety/Judgement: Decreased awareness of safety;Decreased awareness of deficits            Exercises      General Comments        Pertinent Vitals/Pain Pain Assessment: Faces Faces Pain Scale: Hurts little more Pain Location: back with mobility Pain Descriptors / Indicators: Grimacing;Sore;Discomfort Pain Intervention(s): Limited activity within patient's tolerance;Repositioned    Home Living                      Prior Function            PT Goals (current goals can now be found in the care plan section) Progress towards PT goals: Progressing toward goals    Frequency    Min 2X/week      PT Plan Current plan remains appropriate    Co-evaluation              AM-PAC PT "6 Clicks" Mobility   Outcome Measure  Help needed turning from your back  to your side while in a flat bed without using bedrails?: A Lot Help needed moving from lying on your back to sitting on the side of a flat bed without using bedrails?: A Lot Help needed moving to and from a bed to a chair (including a wheelchair)?: Total Help needed standing up from a chair using your arms (e.g., wheelchair or bedside chair)?: Total Help needed to walk in hospital room?: Total Help needed climbing 3-5 steps with a railing? : Total 6 Click Score: 8    End of Session Equipment Utilized During Treatment: Gait belt Activity Tolerance: Patient tolerated treatment well Patient left: in bed;with call bell/phone within reach;with bed alarm set   PT Visit Diagnosis: Unsteadiness on  feet (R26.81);Muscle weakness (generalized) (M62.81);Difficulty in walking, not elsewhere classified (R26.2);History of falling (Z91.81)     Time: 2505-3976 PT Time Calculation (min) (ACUTE ONLY): 25 min  Charges:  $Therapeutic Activity: 23-37 mins                        Weston Anna, PT Acute Rehabilitation Services Pager: [phone] Office: [phone]

## 2018-10-01 NOTE — Progress Notes (Signed)
Notified by tech, patient's O2 dropped to 87%. RN repositioned patient and applied O2 at 2L. Sat is now 95%. Will continue to monitor.

## 2018-10-01 NOTE — Care Management Important Message (Signed)
Important Message  Patient Details  Name: [name] MRN: [id number] Date of Birth: 22-Jan-1918   Medicare Important Message Given:  Yes    Kerin Salen 10/01/2018, 10:07 AMImportant Message  Patient Details  Name: [name] MRN: [id number] Date of Birth: 1917/10/30   Medicare Important Message Given:  Yes    Kerin Salen 10/01/2018, 10:07 AM

## 2018-10-02 LAB — BASIC METABOLIC PANEL
ANION GAP: 14 (ref 5–15)
BUN: 34 mg/dL — ABNORMAL HIGH (ref 8–23)
CO2: 25 mmol/L (ref 22–32)
Calcium: 8 mg/dL — ABNORMAL LOW (ref 8.9–10.3)
Chloride: 100 mmol/L (ref 98–111)
Creatinine, Ser: 3.68 mg/dL — ABNORMAL HIGH (ref 0.44–1.00)
GFR calc Af Amer: 11 mL/min — ABNORMAL LOW (ref >60)
GFR calc non Af Amer: 10 mL/min — ABNORMAL LOW (ref >60)
Glucose, Bld: 119 mg/dL — ABNORMAL HIGH (ref 70–99)
POTASSIUM: 2.9 mmol/L — AB (ref 3.5–5.1)
Sodium: 139 mmol/L (ref 135–145)

## 2018-10-02 MED ORDER — POTASSIUM CHLORIDE CRYS ER 20 MEQ PO TBCR
60.0000 meq | EXTENDED_RELEASE_TABLET | Freq: Once | ORAL | Status: AC
  Administered 2018-10-02: 60 meq via ORAL
  Filled 2018-10-02: qty 3

## 2018-10-02 NOTE — Progress Notes (Signed)
PROGRESS NOTE    [name]  GUY:[id number] DOB: May 18, 1918 DOA: 09/27/2018 PCP: [facility], [facility]    Brief Narrative:  85 year old female with history of hypertension, hypothyroidism, hyperlipidemia, dementia, gout presented with weakness, fall with fever and chills.  Chest x-ray showed possible pneumonia and interstitial lung disease.  She was started on IV antibiotics and intravenous fluids.  Assessment & Plan:   Principal Problem:   CAP (community acquired pneumonia) Active Problems:   AKI (acute kidney injury) ([place])   Essential hypertension   HLD (hyperlipidemia)   Hypothyroidism  Probable community-acquired bacterial left lower lobe pneumonia -Continue Rocephin and Zithromax.  Cultures negative so far.  Urine streptococcal antigen negative.  Flu a and B are negative.  Currently on room air.  Oxygen supplementation as needed -Follow up CXR ordered and reviewed. Vascular congestion with B pleural effusions L>R. See below. Good urine output with IV lasix  Probable pulmonary fibrosis with progression -Chest x-ray suggestive of above.  -on minimal O2 support at this time  Leukocytosis -Improving.   -presently stable  Acute renal failure with metabolic acidosis -Probably has some stage of chronic renal disease, unclear which one though.  -Renal ultrasound was negative for hydronephrosis -Cr up to over 3 after lasix. Hold lasix. Repeat bmet in AM  Hyperlipidemia -Continue with statin as tolerated  Hypertension -Blood pressure on the higher side.  Monitor.   -Will continue with metoprolol and amlodipine as tolerated -BP elevated, requiring hydralazine -will increase norvasc to 10mg  and start scheduled PO hydralazine- -BP improved  Hypothyroidism  -Continue Synthroid as tolerated -Currently stable  Possible dementia -Monitor mental status.   -Appear stable at present  Generalized conditioning  -PT recommends SNF placement.  Social  worker consulted   DVT prophylaxis: Lovenox subQ Code Status: Full Family Communication: Pt in room, family at bedside Disposition Plan: Uncertain at this time  Consultants:     Procedures:     Antimicrobials: Anti-infectives (From admission, onward)   Start     Dose/Rate Route Frequency Ordered Stop   09/29/18 1800  azithromycin (ZITHROMAX) tablet 500 mg     500 mg Oral Every 24 hours 09/29/18 1026     09/28/18 1800  azithromycin (ZITHROMAX) 500 mg in sodium chloride 0.9 % 250 mL IVPB  Status:  Discontinued     500 mg 250 mL/hr over 60 Minutes Intravenous Every 24 hours 09/27/18 2202 09/29/18 1026   09/27/18 2000  azithromycin (ZITHROMAX) 500 mg in sodium chloride 0.9 % 250 mL IVPB     500 mg 250 mL/hr over 60 Minutes Intravenous  Once 09/27/18 1958 09/27/18 2134   09/27/18 1800  cefTRIAXone (ROCEPHIN) 1 g in sodium chloride 0.9 % 100 mL IVPB     1 g 200 mL/hr over 30 Minutes Intravenous Every 24 hours 09/27/18 1751        Subjective: Noted to be sob overnight  Objective: Vitals:   10/01/18 1500 10/01/18 2040 10/02/18 0512 10/02/18 1348  BP: (!) 198/69 (!) 163/62 (!) 150/64 (!) 144/66  Pulse:  84 82 80  Resp:  19 19 (!) 22  Temp:  99.2 F (37.3 C) 98.3 F (36.8 C) 98.7 F (37.1 C)  TempSrc:    Oral  SpO2:  95% 95% 93%  Weight:      Height:        Intake/Output Summary (Last 24 hours) at 10/02/2018 1820 Last data filed at 10/01/2018 1821 Gross per 24 hour  Intake -  Output 650 ml  Net -  650 ml   Filed Weights   09/29/18 0404 09/30/18 1700  Weight: 61.7 kg 62.2 kg    Examination: General exam: Conversant, in no acute distress Respiratory system: normal chest rise, clear, no audible wheezing  Data Reviewed: I have personally reviewed following labs and imaging studies  CBC: Recent Labs  Lab 09/27/18 1824 09/27/18 1836 09/28/18 0540 09/29/18 0748 09/30/18 0621  WBC  --  16.0* 15.8* 12.9* 12.6*  NEUTROABS  --  13.3*  --  10.3* 9.5*  HGB 13.3  13.0 11.5* 10.2* 10.6*  HCT 39.0 41.1 36.6 32.4*  29.3* 33.6*  MCV  --  93.2 94.8 96.4 93.1  PLT  --  394 331 307 426   Basic Metabolic Panel: Recent Labs  Lab 09/28/18 0540 09/29/18 0748 09/30/18 0621 10/01/18 0614 10/02/18 0647  NA 142 142 141 143 139  K 3.9 3.6 3.1* 2.9* 2.9*  CL 112* 114* 108 105 100  CO2 20* 17* 23 25 25   GLUCOSE 104* 77 136* 109* 119*  BUN 34* 30* 26* 25* 34*  CREATININE 2.57* 2.62* 2.69* 2.83* 3.68*  CALCIUM 8.6* 7.9* 8.1* 8.1* 8.0*  MG  --  1.7 1.7  --   --    GFR: Estimated Creatinine Clearance: 7 mL/min (A) (by C-G formula based on SCr of 3.68 mg/dL (H)). Liver Function Tests: Recent Labs  Lab 09/27/18 1836 09/29/18 0748  AST 20 16  ALT 11 10  ALKPHOS 80 63  BILITOT 0.7 0.8  PROT 7.6 5.7*  ALBUMIN 3.4* 2.4*   No results for input(s): LIPASE, AMYLASE in the last 168 hours. Recent Labs  Lab 09/29/18 0748  AMMONIA 18   Coagulation Profile: No results for input(s): INR, PROTIME in the last 168 hours. Cardiac Enzymes: No results for input(s): CKTOTAL, CKMB, CKMBINDEX, TROPONINI in the last 168 hours. BNP (last 3 results) No results for input(s): PROBNP in the last 8760 hours. HbA1C: No results for input(s): HGBA1C in the last 72 hours. CBG: No results for input(s): GLUCAP in the last 168 hours. Lipid Profile: No results for input(s): CHOL, HDL, LDLCALC, TRIG, CHOLHDL, LDLDIRECT in the last 72 hours. Thyroid Function Tests: No results for input(s): TSH, T4TOTAL, FREET4, T3FREE, THYROIDAB in the last 72 hours. Anemia Panel: No results for input(s): VITAMINB12, FOLATE, FERRITIN, TIBC, IRON, RETICCTPCT in the last 72 hours. Sepsis Labs: Recent Labs  Lab 09/27/18 1824  LATICACIDVEN 1.44    Recent Results (from the past 240 hour(s))  Urine culture     Status: None   Collection Time: 09/27/18  5:51 PM  Result Value Ref Range Status   Specimen Description   Final    URINE, RANDOM Performed at [facility], [place] [address], [place], [place] [address]    Special Requests   Final    NONE Performed at [facility], [place] [address] [place], [place] [address]    Culture   Final    NO GROWTH Performed at [facility], [place] [address] [place], [place] [address]    Report Status 09/29/2018 FINAL  Final  Blood Culture (routine x 2)     Status: None (Preliminary result)   Collection Time: 09/27/18  6:36 PM  Result Value Ref Range Status   Specimen Description   Final    LEFT ANTECUBITAL Performed at [facility] [address], [place], [place] [address]    Special Requests   Final    BOTTLES DRAWN AEROBIC AND ANAEROBIC  Blood Culture adequate volume Performed at [facility] [address], [place], [place] [address]    Culture   Final    NO GROWTH 4 DAYS Performed at [facility], [place] [address], [place], [place] [address]    Report Status PENDING  Incomplete  Blood Culture (routine x 2)     Status: None (Preliminary result)   Collection Time: 09/27/18  6:36 PM  Result Value Ref Range Status   Specimen Description   Final    BLOOD LEFT HAND Performed at [facility] [address] [place], [place] [address]    Special Requests   Final    BOTTLES DRAWN AEROBIC AND ANAEROBIC Blood Culture adequate volume Performed at [facility] [address], [place], [place] [address]    Culture   Final    NO GROWTH 4 DAYS Performed at [facility], [place] [address], [place], [place] [address]    Report Status PENDING  Incomplete     Radiology Studies: No results found.  Scheduled Meds: . amLODipine  10 mg Oral Daily  . azithromycin  500 mg Oral Q24H  . enoxaparin (LOVENOX) injection  30 mg Subcutaneous Daily  . febuxostat  40 mg Oral Daily  . hydrALAZINE  10 mg Oral Q8H  . levothyroxine  25 mcg Oral QAC breakfast  . metoprolol succinate  100 mg Oral Daily  . rosuvastatin  2.5  mg Oral q1800   Continuous Infusions: . cefTRIAXone (ROCEPHIN)  IV 1 g (10/02/18 1801)     LOS: 5 days   Marylu Lund, MD [facility] Pager On Amion  If 7PM-7AM, please contact night-coverage 10/02/2018, 6:20 PM

## 2018-10-03 ENCOUNTER — Inpatient Hospital Stay (HOSPITAL_COMMUNITY): Payer: Medicare Other

## 2018-10-03 LAB — BASIC METABOLIC PANEL
Anion gap: 12 (ref 5–15)
BUN: 41 mg/dL — ABNORMAL HIGH (ref 8–23)
CO2: 25 mmol/L (ref 22–32)
Calcium: 8.2 mg/dL — ABNORMAL LOW (ref 8.9–10.3)
Chloride: 103 mmol/L (ref 98–111)
Creatinine, Ser: 4.07 mg/dL — ABNORMAL HIGH (ref 0.44–1.00)
GFR calc Af Amer: 10 mL/min — ABNORMAL LOW (ref >60)
GFR calc non Af Amer: 9 mL/min — ABNORMAL LOW (ref >60)
Glucose, Bld: 166 mg/dL — ABNORMAL HIGH (ref 70–99)
Potassium: 3.4 mmol/L — ABNORMAL LOW (ref 3.5–5.1)
Sodium: 140 mmol/L (ref 135–145)

## 2018-10-03 LAB — CULTURE, BLOOD (ROUTINE X 2)
CULTURE: NO GROWTH
Culture: NO GROWTH
SPECIAL REQUESTS: ADEQUATE
Special Requests: ADEQUATE

## 2018-10-03 MED ORDER — LACTATED RINGERS IV SOLN
INTRAVENOUS | Status: DC
  Administered 2018-10-03 – 2018-10-04 (×3): via INTRAVENOUS

## 2018-10-03 NOTE — Progress Notes (Signed)
PROGRESS NOTE    [name]  WLN:[id number] DOB: August 25, 1918 DOA: 09/27/2018 PCP: [facility]    Brief Narrative:  85 year old female with history of hypertension, hypothyroidism, hyperlipidemia, dementia, gout presented with weakness, fall with fever and chills.  Chest x-ray showed possible pneumonia and interstitial lung disease.  She was started on IV antibiotics and intravenous fluids.  Assessment & Plan:   Principal Problem:   CAP (community acquired pneumonia) Active Problems:   AKI (acute kidney injury) ([place])   Essential hypertension   HLD (hyperlipidemia)   Hypothyroidism  Probable community-acquired bacterial left lower lobe pneumonia -Initially continued Rocephin and Zithromax, completed course of abx.  Cultures negative so far.  Urine streptococcal antigen negative.  Flu a and B are negative.  Currently on room air.  Oxygen supplementation as needed -Follow up CXR ordered and reviewed. Vascular congestion with B pleural effusions L>R. See below. Good urine output with IV lasix. Diuretic on hold given ARF below  Probable pulmonary fibrosis with progression -Chest x-ray suggestive of above.  -on minimal O2 support at this time  Leukocytosis -Improving.   -currently stable  Acute renal failure with metabolic acidosis -Probably has some stage of chronic renal disease, unclear which one though.  -Renal ultrasound was negative for hydronephrosis -Cr up to over 4 today. Minimal urine output noted -Continue on gentle IVF hydration -Repeat bmet in AM  Hyperlipidemia -Continue with statin as tolerated  Hypertension -Will continue with metoprolol and amlodipine as tolerated -BP elevated, requiring hydralazine -Increased norvasc to 10mg  with scheduled PO hydralazine- -BP improved  Hypothyroidism  -Continue Synthroid as tolerated -Presently stable  Possible dementia -Monitor mental status.   -Appear stable at  present  Generalized conditioning  -PT recommends SNF placement.  Social worker consulted   DVT prophylaxis: Lovenox subQ Code Status: Full Family Communication: Pt in room, family at bedside Disposition Plan: Uncertain at this time  Consultants:     Procedures:     Antimicrobials: Anti-infectives (From admission, onward)   Start     Dose/Rate Route Frequency Ordered Stop   09/29/18 1800  azithromycin (ZITHROMAX) tablet 500 mg  Status:  Discontinued     500 mg Oral Every 24 hours 09/29/18 1026 10/03/18 1243   09/28/18 1800  azithromycin (ZITHROMAX) 500 mg in sodium chloride 0.9 % 250 mL IVPB  Status:  Discontinued     500 mg 250 mL/hr over 60 Minutes Intravenous Every 24 hours 09/27/18 2202 09/29/18 1026   09/27/18 2000  azithromycin (ZITHROMAX) 500 mg in sodium chloride 0.9 % 250 mL IVPB     500 mg 250 mL/hr over 60 Minutes Intravenous  Once 09/27/18 1958 09/27/18 2134   09/27/18 1800  cefTRIAXone (ROCEPHIN) 1 g in sodium chloride 0.9 % 100 mL IVPB  Status:  Discontinued     1 g 200 mL/hr over 30 Minutes Intravenous Every 24 hours 09/27/18 1751 10/03/18 1243      Subjective: Not sob, not eating  Objective: Vitals:   10/02/18 1348 10/02/18 1942 10/03/18 0532 10/03/18 1315  BP: (!) 144/66 (!) 152/58 (!) 142/60 (!) 160/65  Pulse: 80 87 78 80  Resp: (!) 22 15 15  (!) 22  Temp: 98.7 F (37.1 C) 98.5 F (36.9 C) (!) 97.5 F (36.4 C) (!) 97 F (36.1 C)  TempSrc: Oral     SpO2: 93% 93% 93% 94%  Weight:      Height:        Intake/Output Summary (Last 24 hours) at 10/03/2018 1802 Last  data filed at 10/03/2018 1527 Gross per 24 hour  Intake 188.05 ml  Output 675 ml  Net -486.95 ml   Filed Weights   09/29/18 0404 09/30/18 1700  Weight: 61.7 kg 62.2 kg    Examination: General exam: Awake, laying in bed, in nad Respiratory system: Normal respiratory effort, no wheezing Cardiovascular system: regular rate, s1, s2 Gastrointestinal system: Soft, nondistended,  positive BS Central nervous system: CN2-12 grossly intact, strength intact Extremities: Perfused, no clubbing Skin: Normal skin turgor, no notable skin lesions seen Psychiatry: Mood normal // no visual hallucinations   Data Reviewed: I have personally reviewed following labs and imaging studies  CBC: Recent Labs  Lab 09/27/18 1824 09/27/18 1836 09/28/18 0540 09/29/18 0748 09/30/18 0621  WBC  --  16.0* 15.8* 12.9* 12.6*  NEUTROABS  --  13.3*  --  10.3* 9.5*  HGB 13.3 13.0 11.5* 10.2* 10.6*  HCT 39.0 41.1 36.6 32.4*  29.3* 33.6*  MCV  --  93.2 94.8 96.4 93.1  PLT  --  394 331 307 035   Basic Metabolic Panel: Recent Labs  Lab 09/29/18 0748 09/30/18 0621 10/01/18 0614 10/02/18 0647 10/03/18 0650  NA 142 141 143 139 140  K 3.6 3.1* 2.9* 2.9* 3.4*  CL 114* 108 105 100 103  CO2 17* 23 25 25 25   GLUCOSE 77 136* 109* 119* 166*  BUN 30* 26* 25* 34* 41*  CREATININE 2.62* 2.69* 2.83* 3.68* 4.07*  CALCIUM 7.9* 8.1* 8.1* 8.0* 8.2*  MG 1.7 1.7  --   --   --    GFR: Estimated Creatinine Clearance: 6.4 mL/min (A) (by C-G formula based on SCr of 4.07 mg/dL (H)). Liver Function Tests: Recent Labs  Lab 09/27/18 1836 09/29/18 0748  AST 20 16  ALT 11 10  ALKPHOS 80 63  BILITOT 0.7 0.8  PROT 7.6 5.7*  ALBUMIN 3.4* 2.4*   No results for input(s): LIPASE, AMYLASE in the last 168 hours. Recent Labs  Lab 09/29/18 0748  AMMONIA 18   Coagulation Profile: No results for input(s): INR, PROTIME in the last 168 hours. Cardiac Enzymes: No results for input(s): CKTOTAL, CKMB, CKMBINDEX, TROPONINI in the last 168 hours. BNP (last 3 results) No results for input(s): PROBNP in the last 8760 hours. HbA1C: No results for input(s): HGBA1C in the last 72 hours. CBG: No results for input(s): GLUCAP in the last 168 hours. Lipid Profile: No results for input(s): CHOL, HDL, LDLCALC, TRIG, CHOLHDL, LDLDIRECT in the last 72 hours. Thyroid Function Tests: No results for input(s): TSH,  T4TOTAL, FREET4, T3FREE, THYROIDAB in the last 72 hours. Anemia Panel: No results for input(s): VITAMINB12, FOLATE, FERRITIN, TIBC, IRON, RETICCTPCT in the last 72 hours. Sepsis Labs: Recent Labs  Lab 09/27/18 1824  LATICACIDVEN 1.44    Recent Results (from the past 240 hour(s))  Urine culture     Status: None   Collection Time: 09/27/18  5:51 PM  Result Value Ref Range Status   Specimen Description   Final    URINE, RANDOM Performed at [facility], [place] [address] [place], [place] [address]    Special Requests   Final    NONE Performed at [facility], [place] [address], [place], [place] [address]    Culture   Final    NO GROWTH Performed at [facility], [place] [address], [place],  [address]    Report Status 09/29/2018 FINAL  Final  Blood Culture (routine x 2)  Status: None   Collection Time: 09/27/18  6:36 PM  Result Value Ref Range Status   Specimen Description LEFT ANTECUBITAL  Final   Special Requests   Final    BOTTLES DRAWN AEROBIC AND ANAEROBIC Blood Culture adequate volume Performed at [facility] [address] [place], [place] [address]    Culture NO GROWTH 5 DAYS  Final   Report Status 10/03/2018 FINAL  Final  Blood Culture (routine x 2)     Status: None   Collection Time: 09/27/18  6:36 PM  Result Value Ref Range Status   Specimen Description BLOOD LEFT HAND  Final   Special Requests   Final    BOTTLES DRAWN AEROBIC AND ANAEROBIC Blood Culture adequate volume Performed at [facility] [address], [place], [place] [address]    Culture NO GROWTH 5 DAYS  Final   Report Status 10/03/2018 FINAL  Final     Radiology Studies: US Renal  Result Date: 10/03/2018 CLINICAL DATA:  Acute renal failure. EXAM: RENAL / URINARY TRACT ULTRASOUND COMPLETE COMPARISON:  Renal ultrasound, 09/28/2018. FINDINGS: Right Kidney: Renal measurements: 10 x 3.6 x 4.2 cm = volume: 80 mL.  Increased parenchymal echogenicity. Mild dilation of the renal pelvis. No defined mass or cyst. No stones. Left Kidney: Renal measurements: 10.3 x 4.3 x 3.5 cm = volume: 82 mL. Increased parenchymal echogenicity. Cortical cysts, largest from the lower pole measures 4 x 2.3 x 3.6 cm. Upper pole cyst measuring 1.1 x 0.9 x 1.1 cm. Bladder: Not visualized. Note is made of a right pleural effusion. IMPRESSION: 1. Mild right renal pelvis dilation. This is likely chronic. Consider low-grade hydronephrosis if there are consistent clinical findings. 2. Bilateral increased renal parenchymal echogenicity consistent with medical renal disease. 3. Left renal cortical cyst stable from prior ultrasound. Electronically Signed   By: Lajean Manes M.D.   On: 10/03/2018 15:35    Scheduled Meds: . amLODipine  10 mg Oral Daily  . enoxaparin (LOVENOX) injection  30 mg Subcutaneous Daily  . febuxostat  40 mg Oral Daily  . hydrALAZINE  10 mg Oral Q8H  . levothyroxine  25 mcg Oral QAC breakfast  . metoprolol succinate  100 mg Oral Daily  . rosuvastatin  2.5 mg Oral q1800   Continuous Infusions: . lactated ringers 75 mL/hr at 10/03/18 1527     LOS: 6 days   Marylu Lund, MD [facility] Pager On Amion  If 7PM-7AM, please contact night-coverage 10/03/2018, 6:02 PM

## 2018-10-04 LAB — BASIC METABOLIC PANEL
Anion gap: 12 (ref 5–15)
BUN: 42 mg/dL — ABNORMAL HIGH (ref 8–23)
CO2: 25 mmol/L (ref 22–32)
Calcium: 8.3 mg/dL — ABNORMAL LOW (ref 8.9–10.3)
Chloride: 103 mmol/L (ref 98–111)
Creatinine, Ser: 4.1 mg/dL — ABNORMAL HIGH (ref 0.44–1.00)
GFR calc Af Amer: 10 mL/min — ABNORMAL LOW (ref >60)
GFR calc non Af Amer: 9 mL/min — ABNORMAL LOW (ref >60)
Glucose, Bld: 124 mg/dL — ABNORMAL HIGH (ref 70–99)
Potassium: 3.5 mmol/L (ref 3.5–5.1)
Sodium: 140 mmol/L (ref 135–145)

## 2018-10-04 NOTE — Progress Notes (Signed)
PROGRESS NOTE    [name]  LFY:[id number] DOB: 1918-05-11 DOA: 09/27/2018 PCP: [facility]ne[facility], [facility]    Brief Narrative:  85 year old female with history of hypertension, hypothyroidism, hyperlipidemia, dementia, gout presented with weakness, fall with fever and chills.  Chest x-ray showed possible pneumonia and interstitial lung disease.  She was started on IV antibiotics and intravenous fluids.  Assessment & Plan:   Principal Problem:   CAP (community acquired pneumonia) Active Problems:   AKI (acute kidney injury) ([place])   Essential hypertension   HLD (hyperlipidemia)   Hypothyroidism  Probable community-acquired bacterial left lower lobe pneumonia -Initially continued Rocephin and Zithromax, completed course of abx.  Cultures negative so far.  Urine streptococcal antigen negative.  Flu a and B are negative.  Currently on room air.  Oxygen supplementation as needed -Follow up CXR ordered and reviewed. Vascular congestion with B pleural effusions L>R. See below. Good urine output recently noted with IV lasix. Diuretic on hold given ARF below  Probable pulmonary fibrosis with progression -Chest x-ray suggestive of above.  -remains on minimal O2 support at this time  Leukocytosis -Noted to improve   -presently stable  Acute renal failure with metabolic acidosis -Probably has some stage of chronic renal disease, unclear which one though.  -Renal ultrasound was negative for hydronephrosis -Cr remains at over 4, however seems to begin to plateau -Continue on gentle IVF hydration -Repeat bmet in AM -Stopped abx per above given concerns for possible ATN as well  Hyperlipidemia -Continue with statin as tolerated -Presently stable  Hypertension -Will continue with metoprolol and amlodipine as tolerated -BP elevated, requiring hydralazine -Increased norvasc to 10mg  with scheduled PO hydralazine- -BP remains stable  Hypothyroidism  -Continue  Synthroid as tolerated -Currently stable  Possible dementia -Monitor mental status.   -presently stable  Generalized conditioning  -PT recommends SNF placement.  Social worker consulted -Plan to d/c when medically stable   DVT prophylaxis: Lovenox subQ Code Status: Full Family Communication: Pt in room, family at bedside Disposition Plan: SNF when medically stable  Consultants:     Procedures:     Antimicrobials: Anti-infectives (From admission, onward)   Start     Dose/Rate Route Frequency Ordered Stop   09/29/18 1800  azithromycin (ZITHROMAX) tablet 500 mg  Status:  Discontinued     500 mg Oral Every 24 hours 09/29/18 1026 10/03/18 1243   09/28/18 1800  azithromycin (ZITHROMAX) 500 mg in sodium chloride 0.9 % 250 mL IVPB  Status:  Discontinued     500 mg 250 mL/hr over 60 Minutes Intravenous Every 24 hours 09/27/18 2202 09/29/18 1026   09/27/18 2000  azithromycin (ZITHROMAX) 500 mg in sodium chloride 0.9 % 250 mL IVPB     500 mg 250 mL/hr over 60 Minutes Intravenous  Once 09/27/18 1958 09/27/18 2134   09/27/18 1800  cefTRIAXone (ROCEPHIN) 1 g in sodium chloride 0.9 % 100 mL IVPB  Status:  Discontinued     1 g 200 mL/hr over 30 Minutes Intravenous Every 24 hours 09/27/18 1751 10/03/18 1243      Subjective: Without complaints. No sob  Objective: Vitals:   10/03/18 1315 10/03/18 2049 10/04/18 0525 10/04/18 0931  BP: (!) 160/65 (!) 144/65 (!) 155/78 (!) 167/76  Pulse: 80 82 73 70  Resp: (!) 22 19 19    Temp: (!) 97 F (36.1 C) (!) 97.5 F (36.4 C) 97.7 F (36.5 C)   TempSrc:  Oral Axillary   SpO2: 94% 94% 93% 99%  Weight:  Height:        Intake/Output Summary (Last 24 hours) at 10/04/2018 1347 Last data filed at 10/04/2018 1205 Gross per 24 hour  Intake 1366.06 ml  Output 400 ml  Net 966.06 ml   Filed Weights   09/29/18 0404 09/30/18 1700  Weight: 61.7 kg 62.2 kg    Examination: General exam: Conversant, in no acute distress Respiratory  system: normal chest rise, clear, no audible wheezing Cardiovascular system: regular rhythm, s1-s2 Gastrointestinal system: Nondistended, nontender, pos BS Central nervous system: No seizures, no tremors Extremities: No cyanosis, no joint deformities Skin: No rashes, no pallor Psychiatry: Affect normal // no auditory hallucinations    Data Reviewed: I have personally reviewed following labs and imaging studies  CBC: Recent Labs  Lab 09/27/18 1824 09/27/18 1836 09/28/18 0540 09/29/18 0748 09/30/18 0621  WBC  --  16.0* 15.8* 12.9* 12.6*  NEUTROABS  --  13.3*  --  10.3* 9.5*  HGB 13.3 13.0 11.5* 10.2* 10.6*  HCT 39.0 41.1 36.6 32.4*  29.3* 33.6*  MCV  --  93.2 94.8 96.4 93.1  PLT  --  394 331 307 195   Basic Metabolic Panel: Recent Labs  Lab 09/29/18 0748 09/30/18 0621 10/01/18 0614 10/02/18 0647 10/03/18 0650 10/04/18 0622  NA 142 141 143 139 140 140  K 3.6 3.1* 2.9* 2.9* 3.4* 3.5  CL 114* 108 105 100 103 103  CO2 17* 23 25 25 25 25   GLUCOSE 77 136* 109* 119* 166* 124*  BUN 30* 26* 25* 34* 41* 42*  CREATININE 2.62* 2.69* 2.83* 3.68* 4.07* 4.10*  CALCIUM 7.9* 8.1* 8.1* 8.0* 8.2* 8.3*  MG 1.7 1.7  --   --   --   --    GFR: Estimated Creatinine Clearance: 6.3 mL/min (A) (by C-G formula based on SCr of 4.1 mg/dL (H)). Liver Function Tests: Recent Labs  Lab 09/27/18 1836 09/29/18 0748  AST 20 16  ALT 11 10  ALKPHOS 80 63  BILITOT 0.7 0.8  PROT 7.6 5.7*  ALBUMIN 3.4* 2.4*   No results for input(s): LIPASE, AMYLASE in the last 168 hours. Recent Labs  Lab 09/29/18 0748  AMMONIA 18   Coagulation Profile: No results for input(s): INR, PROTIME in the last 168 hours. Cardiac Enzymes: No results for input(s): CKTOTAL, CKMB, CKMBINDEX, TROPONINI in the last 168 hours. BNP (last 3 results) No results for input(s): PROBNP in the last 8760 hours. HbA1C: No results for input(s): HGBA1C in the last 72 hours. CBG: No results for input(s): GLUCAP in the last 168  hours. Lipid Profile: No results for input(s): CHOL, HDL, LDLCALC, TRIG, CHOLHDL, LDLDIRECT in the last 72 hours. Thyroid Function Tests: No results for input(s): TSH, T4TOTAL, FREET4, T3FREE, THYROIDAB in the last 72 hours. Anemia Panel: No results for input(s): VITAMINB12, FOLATE, FERRITIN, TIBC, IRON, RETICCTPCT in the last 72 hours. Sepsis Labs: Recent Labs  Lab 09/27/18 1824  LATICACIDVEN 1.44    Recent Results (from the past 240 hour(s))  Urine culture     Status: None   Collection Time: 09/27/18  5:51 PM  Result Value Ref Range Status   Specimen Description   Final    URINE, RANDOM Performed at [facility], [place] [address] [place], [place] [address]    Special Requests   Final    NONE Performed at [facility], [place] [address], [place], [place] [address]    Culture   Final    NO GROWTH Performed at [facility], [place] [address], [place], [place] [address]    Report Status 09/29/2018 FINAL  Final  Blood Culture (routine x 2)     Status: None   Collection Time: 09/27/18  6:36 PM  Result Value Ref Range Status   Specimen Description LEFT ANTECUBITAL  Final   Special Requests   Final    BOTTLES DRAWN AEROBIC AND ANAEROBIC Blood Culture adequate volume Performed at [place] [address], [place], [place] [address]    Culture NO GROWTH 5 DAYS  Final   Report Status 10/03/2018 FINAL  Final  Blood Culture (routine x 2)     Status: None   Collection Time: 09/27/18  6:36 PM  Result Value Ref Range Status   Specimen Description BLOOD LEFT HAND  Final   Special Requests   Final    BOTTLES DRAWN AEROBIC AND ANAEROBIC Blood Culture adequate volume Performed at [place] [address], [place], [place] [address]    Culture NO GROWTH 5 DAYS  Final   Report Status 10/03/2018 FINAL  Final     Radiology Studies: US Renal  Result Date: 10/03/2018 CLINICAL DATA:  Acute renal failure.  EXAM: RENAL / URINARY TRACT ULTRASOUND COMPLETE COMPARISON:  Renal ultrasound, 09/28/2018. FINDINGS: Right Kidney: Renal measurements: 10 x 3.6 x 4.2 cm = volume: 80 mL. Increased parenchymal echogenicity. Mild dilation of the renal pelvis. No defined mass or cyst. No stones. Left Kidney: Renal measurements: 10.3 x 4.3 x 3.5 cm = volume: 82 mL. Increased parenchymal echogenicity. Cortical cysts, largest from the lower pole measures 4 x 2.3 x 3.6 cm. Upper pole cyst measuring 1.1 x 0.9 x 1.1 cm. Bladder: Not visualized. Note is made of a right pleural effusion. IMPRESSION: 1. Mild right renal pelvis dilation. This is likely chronic. Consider low-grade hydronephrosis if there are consistent clinical findings. 2. Bilateral increased renal parenchymal echogenicity consistent with medical renal disease. 3. Left renal cortical cyst stable from prior ultrasound. Electronically Signed   By: Lajean Manes M.D.   On: 10/03/2018 15:35    Scheduled Meds: . amLODipine  10 mg Oral Daily  . enoxaparin (LOVENOX) injection  30 mg Subcutaneous Daily  . febuxostat  40 mg Oral Daily  . hydrALAZINE  10 mg Oral Q8H  . levothyroxine  25 mcg Oral QAC breakfast  . metoprolol succinate  100 mg Oral Daily  . rosuvastatin  2.5 mg Oral q1800   Continuous Infusions: . lactated ringers 75 mL/hr at 10/04/18 1013     LOS: 7 days   Marylu Lund, MD [facility] Pager On Amion  If 7PM-7AM, please contact night-coverage 10/04/2018, 1:47 PM

## 2018-10-05 LAB — BASIC METABOLIC PANEL
Anion gap: 12 (ref 5–15)
BUN: 42 mg/dL — ABNORMAL HIGH (ref 8–23)
CHLORIDE: 107 mmol/L (ref 98–111)
CO2: 23 mmol/L (ref 22–32)
Calcium: 8.3 mg/dL — ABNORMAL LOW (ref 8.9–10.3)
Creatinine, Ser: 3.65 mg/dL — ABNORMAL HIGH (ref 0.44–1.00)
GFR calc Af Amer: 11 mL/min — ABNORMAL LOW (ref >60)
GFR calc non Af Amer: 10 mL/min — ABNORMAL LOW (ref >60)
GLUCOSE: 107 mg/dL — AB (ref 70–99)
Potassium: 3.9 mmol/L (ref 3.5–5.1)
Sodium: 142 mmol/L (ref 135–145)

## 2018-10-05 LAB — MAGNESIUM: Magnesium: 1.7 mg/dL (ref 1.7–2.4)

## 2018-10-05 NOTE — Progress Notes (Addendum)
PROGRESS NOTE    Caira Poche  DZH:[id number] DOB: 1917-10-03 DOA: 09/27/2018 PCP: [facility]ne[facility], [facility]    Brief Narrative:  85 year old female with history of hypertension, hypothyroidism, hyperlipidemia, dementia, gout presented with weakness, fall with fever and chills.  Chest x-ray showed possible pneumonia and interstitial lung disease.  She was started on IV antibiotics and intravenous fluids.  Assessment & Plan:   Principal Problem:   CAP (community acquired pneumonia) Active Problems:   AKI (acute kidney injury) ([place])   Essential hypertension   HLD (hyperlipidemia)   Hypothyroidism  Probable community-acquired bacterial left lower lobe pneumonia -Initially continued Rocephin and Zithromax, completed course of abx.  Cultures negative so far.  Urine streptococcal antigen negative.  Flu a and B are negative.  Currently on room air.  Oxygen supplementation as needed -Follow up CXR ordered and reviewed. Vascular congestion with B pleural effusions L>R. See below.  -Large volume urine output recently noted with IV lasix.  -Diuretic remains on hold given ARF below  Probable pulmonary fibrosis with progression -Chest x-ray suggestive of above.  -currently on room air  Leukocytosis -Noted to improve   -currently stable  Acute renal failure with metabolic acidosis -Probably has some stage of chronic renal disease, unclear which one though.  -Renal ultrasound was negative for hydronephrosis -Cr now showing improvement with Cr of 3.65 -Tolerating gentle IVF hydration -Repeat bmet in AM -Stopped abx per above given concerns for possible ATN as well  Hyperlipidemia -Continue with statin as tolerated -Remains stable  Hypertension -Will continue with metoprolol and amlodipine as tolerated -BP elevated, requiring hydralazine -Increased norvasc to 10mg  with scheduled PO hydralazine- -BP currently stable  Hypothyroidism  -Continue Synthroid as  tolerated -Presently stable  Possible dementia -Monitor mental status.   -currently stable  Generalized conditioning  -PT recommends SNF placement.  Social worker consulted -Anticipate d/c when medically stable  DVT prophylaxis: Lovenox subQ Code Status: Full Family Communication: Pt in room, family at bedside Disposition Plan: SNF when medically stable  Consultants:     Procedures:     Antimicrobials: Anti-infectives (From admission, onward)   Start     Dose/Rate Route Frequency Ordered Stop   09/29/18 1800  azithromycin (ZITHROMAX) tablet 500 mg  Status:  Discontinued     500 mg Oral Every 24 hours 09/29/18 1026 10/03/18 1243   09/28/18 1800  azithromycin (ZITHROMAX) 500 mg in sodium chloride 0.9 % 250 mL IVPB  Status:  Discontinued     500 mg 250 mL/hr over 60 Minutes Intravenous Every 24 hours 09/27/18 2202 09/29/18 1026   09/27/18 2000  azithromycin (ZITHROMAX) 500 mg in sodium chloride 0.9 % 250 mL IVPB     500 mg 250 mL/hr over 60 Minutes Intravenous  Once 09/27/18 1958 09/27/18 2134   09/27/18 1800  cefTRIAXone (ROCEPHIN) 1 g in sodium chloride 0.9 % 100 mL IVPB  Status:  Discontinued     1 g 200 mL/hr over 30 Minutes Intravenous Every 24 hours 09/27/18 1751 10/03/18 1243      Subjective: No complaints at this time  Objective: Vitals:   10/04/18 1403 10/04/18 2015 10/05/18 0613 10/05/18 1441  BP: (!) 174/75 (!) 153/72 (!) 154/62 139/61  Pulse: 71 67 76 66  Resp: 14 16 18  (!) 22  Temp: (!) 97.3 F (36.3 C) (!) 97.5 F (36.4 C) 97.6 F (36.4 C) 98 F (36.7 C)  TempSrc: Oral Axillary Oral Oral  SpO2: 96% 98% 98% 94%  Weight:  Height:        Intake/Output Summary (Last 24 hours) at 10/05/2018 1523 Last data filed at 10/05/2018 0742 Gross per 24 hour  Intake 1238.63 ml  Output 700 ml  Net 538.63 ml   Filed Weights   09/29/18 0404 09/30/18 1700  Weight: 61.7 kg 62.2 kg    Examination: General exam: Awake, laying in bed, in  nad Respiratory system: Normal respiratory effort, no wheezing Cardiovascular system: regular rate, s1, s2  Data Reviewed: I have personally reviewed following labs and imaging studies  CBC: Recent Labs  Lab 09/29/18 0748 09/30/18 0621  WBC 12.9* 12.6*  NEUTROABS 10.3* 9.5*  HGB 10.2* 10.6*  HCT 32.4*  29.3* 33.6*  MCV 96.4 93.1  PLT 307 540   Basic Metabolic Panel: Recent Labs  Lab 09/29/18 0748 09/30/18 0621 10/01/18 0614 10/02/18 0647 10/03/18 0650 10/04/18 0622 10/05/18 0600  NA 142 141 143 139 140 140 142  K 3.6 3.1* 2.9* 2.9* 3.4* 3.5 3.9  CL 114* 108 105 100 103 103 107  CO2 17* 23 25 25 25 25 23   GLUCOSE 77 136* 109* 119* 166* 124* 107*  BUN 30* 26* 25* 34* 41* 42* 42*  CREATININE 2.62* 2.69* 2.83* 3.68* 4.07* 4.10* 3.65*  CALCIUM 7.9* 8.1* 8.1* 8.0* 8.2* 8.3* 8.3*  MG 1.7 1.7  --   --   --   --  1.7   GFR: Estimated Creatinine Clearance: 7.1 mL/min (A) (by C-G formula based on SCr of 3.65 mg/dL (H)). Liver Function Tests: Recent Labs  Lab 09/29/18 0748  AST 16  ALT 10  ALKPHOS 63  BILITOT 0.8  PROT 5.7*  ALBUMIN 2.4*   No results for input(s): LIPASE, AMYLASE in the last 168 hours. Recent Labs  Lab 09/29/18 0748  AMMONIA 18   Coagulation Profile: No results for input(s): INR, PROTIME in the last 168 hours. Cardiac Enzymes: No results for input(s): CKTOTAL, CKMB, CKMBINDEX, TROPONINI in the last 168 hours. BNP (last 3 results) No results for input(s): PROBNP in the last 8760 hours. HbA1C: No results for input(s): HGBA1C in the last 72 hours. CBG: No results for input(s): GLUCAP in the last 168 hours. Lipid Profile: No results for input(s): CHOL, HDL, LDLCALC, TRIG, CHOLHDL, LDLDIRECT in the last 72 hours. Thyroid Function Tests: No results for input(s): TSH, T4TOTAL, FREET4, T3FREE, THYROIDAB in the last 72 hours. Anemia Panel: No results for input(s): VITAMINB12, FOLATE, FERRITIN, TIBC, IRON, RETICCTPCT in the last 72 hours. Sepsis  Labs: No results for input(s): PROCALCITON, LATICACIDVEN in the last 168 hours.  Recent Results (from the past 240 hour(s))  Urine culture     Status: None   Collection Time: 09/27/18  5:51 PM  Result Value Ref Range Status   Specimen Description   Final    URINE, RANDOM Performed at [place] [address], [place], [place] [address]    Special Requests   Final    NONE Performed at [facility], [place] [address] [place], [place] [address]    Culture   Final    NO GROWTH Performed at [facility], [place] [address], [place], [place] [address]    Report Status 09/29/2018 FINAL  Final  Blood Culture (routine x 2)     Status: None   Collection Time: 09/27/18  6:36 PM  Result Value Ref Range Status   Specimen Description LEFT ANTECUBITAL  Final   Special Requests   Final    BOTTLES DRAWN  AEROBIC AND ANAEROBIC Blood Culture adequate volume Performed at [place] [address], [place], [place] [address]    Culture NO GROWTH 5 DAYS  Final   Report Status 10/03/2018 FINAL  Final  Blood Culture (routine x 2)     Status: None   Collection Time: 09/27/18  6:36 PM  Result Value Ref Range Status   Specimen Description BLOOD LEFT HAND  Final   Special Requests   Final    BOTTLES DRAWN AEROBIC AND ANAEROBIC Blood Culture adequate volume Performed at [facility] [address], [place], [place] [address]    Culture NO GROWTH 5 DAYS  Final   Report Status 10/03/2018 FINAL  Final     Radiology Studies: No results found.  Scheduled Meds: . amLODipine  10 mg Oral Daily  . enoxaparin (LOVENOX) injection  30 mg Subcutaneous Daily  . febuxostat  40 mg Oral Daily  . hydrALAZINE  10 mg Oral Q8H  . levothyroxine  25 mcg Oral QAC breakfast  . metoprolol succinate  100 mg Oral Daily  . rosuvastatin  2.5 mg Oral q1800   Continuous Infusions: . lactated ringers 75 mL/hr at 10/04/18 2318     LOS: 8  days   Marylu Lund, MD [facility] Pager On Amion  If 7PM-7AM, please contact night-coverage 10/05/2018, 3:23 PM

## 2018-10-05 NOTE — Care Management Important Message (Signed)
Important Message  Patient Details  Name: Evgenia Merriman MRN: [id number] Date of Birth: 02-20-1918   Medicare Important Message Given:  Yes    Kerin Salen 10/05/2018, 1:34 PMImportant Message  Patient Details  Name: Mulki Roesler MRN: [id number] Date of Birth: 08-22-1918   Medicare Important Message Given:  Yes    Kerin Salen 10/05/2018, 1:34 PM

## 2018-10-05 NOTE — Progress Notes (Signed)
LCSW following for facility placement.   Patient not stable for dc today.   LCSW will continue to follow for dc needs.   Carolin Coy [place]

## 2018-10-06 DIAGNOSIS — N179 Acute kidney failure, unspecified: Secondary | ICD-10-CM | POA: Diagnosis not present

## 2018-10-06 DIAGNOSIS — F0280 Dementia in other diseases classified elsewhere without behavioral disturbance: Secondary | ICD-10-CM | POA: Diagnosis not present

## 2018-10-06 DIAGNOSIS — I672 Cerebral atherosclerosis: Secondary | ICD-10-CM | POA: Diagnosis not present

## 2018-10-06 DIAGNOSIS — E872 Acidosis: Secondary | ICD-10-CM | POA: Diagnosis not present

## 2018-10-06 DIAGNOSIS — R41841 Cognitive communication deficit: Secondary | ICD-10-CM | POA: Diagnosis not present

## 2018-10-06 DIAGNOSIS — M2550 Pain in unspecified joint: Secondary | ICD-10-CM | POA: Diagnosis not present

## 2018-10-06 DIAGNOSIS — J189 Pneumonia, unspecified organism: Secondary | ICD-10-CM | POA: Diagnosis not present

## 2018-10-06 DIAGNOSIS — I959 Hypotension, unspecified: Secondary | ICD-10-CM | POA: Diagnosis not present

## 2018-10-06 DIAGNOSIS — M6281 Muscle weakness (generalized): Secondary | ICD-10-CM | POA: Diagnosis not present

## 2018-10-06 DIAGNOSIS — J181 Lobar pneumonia, unspecified organism: Secondary | ICD-10-CM | POA: Diagnosis not present

## 2018-10-06 DIAGNOSIS — Z7401 Bed confinement status: Secondary | ICD-10-CM | POA: Diagnosis not present

## 2018-10-06 DIAGNOSIS — R2689 Other abnormalities of gait and mobility: Secondary | ICD-10-CM | POA: Diagnosis not present

## 2018-10-06 DIAGNOSIS — R410 Disorientation, unspecified: Secondary | ICD-10-CM | POA: Diagnosis not present

## 2018-10-06 DIAGNOSIS — I158 Other secondary hypertension: Secondary | ICD-10-CM | POA: Diagnosis not present

## 2018-10-06 DIAGNOSIS — R634 Abnormal weight loss: Secondary | ICD-10-CM | POA: Diagnosis not present

## 2018-10-06 DIAGNOSIS — M109 Gout, unspecified: Secondary | ICD-10-CM | POA: Diagnosis not present

## 2018-10-06 DIAGNOSIS — R630 Anorexia: Secondary | ICD-10-CM | POA: Diagnosis not present

## 2018-10-06 DIAGNOSIS — E785 Hyperlipidemia, unspecified: Secondary | ICD-10-CM | POA: Diagnosis not present

## 2018-10-06 DIAGNOSIS — I10 Essential (primary) hypertension: Secondary | ICD-10-CM | POA: Diagnosis not present

## 2018-10-06 DIAGNOSIS — N185 Chronic kidney disease, stage 5: Secondary | ICD-10-CM | POA: Diagnosis not present

## 2018-10-06 DIAGNOSIS — E782 Mixed hyperlipidemia: Secondary | ICD-10-CM | POA: Diagnosis not present

## 2018-10-06 DIAGNOSIS — E039 Hypothyroidism, unspecified: Secondary | ICD-10-CM | POA: Diagnosis not present

## 2018-10-06 DIAGNOSIS — F0390 Unspecified dementia without behavioral disturbance: Secondary | ICD-10-CM | POA: Diagnosis not present

## 2018-10-06 LAB — BASIC METABOLIC PANEL
ANION GAP: 10 (ref 5–15)
BUN: 42 mg/dL — ABNORMAL HIGH (ref 8–23)
CHLORIDE: 107 mmol/L (ref 98–111)
CO2: 24 mmol/L (ref 22–32)
Calcium: 8.3 mg/dL — ABNORMAL LOW (ref 8.9–10.3)
Creatinine, Ser: 3.54 mg/dL — ABNORMAL HIGH (ref 0.44–1.00)
GFR calc Af Amer: 12 mL/min — ABNORMAL LOW (ref >60)
GFR calc non Af Amer: 10 mL/min — ABNORMAL LOW (ref >60)
Glucose, Bld: 101 mg/dL — ABNORMAL HIGH (ref 70–99)
Potassium: 3.9 mmol/L (ref 3.5–5.1)
Sodium: 141 mmol/L (ref 135–145)

## 2018-10-06 MED ORDER — HYDRALAZINE HCL 25 MG PO TABS
25.0000 mg | ORAL_TABLET | Freq: Three times a day (TID) | ORAL | Status: DC
  Administered 2018-10-06 (×2): 25 mg via ORAL
  Filled 2018-10-06 (×2): qty 1

## 2018-10-06 MED ORDER — AMLODIPINE BESYLATE 10 MG PO TABS: 10.0000 mg | ORAL_TABLET | Freq: Every day | ORAL | 0 refills | Status: AC

## 2018-10-06 MED ORDER — HYDRALAZINE HCL 25 MG PO TABS: 25.0000 mg | ORAL_TABLET | Freq: Three times a day (TID) | ORAL | 0 refills | Status: AC

## 2018-10-06 NOTE — Progress Notes (Signed)
Transported at this time via [facility] to [facility]. Informed transport that she was given her BP med and Tylenol just now for tonight.  Patient alert and disoriented but stood and walked to stretcher with assistance and tolerated well.

## 2018-10-06 NOTE — Progress Notes (Signed)
Report called to Olivia Mackie at [facility]

## 2018-10-06 NOTE — Discharge Summary (Signed)
Physician Discharge Summary  [name] FYB:[id number] DOB: [birth year] DOA: 09/27/2018  PCP: [name], MD  Admit date: 09/27/2018 Discharge date: 10/06/2018  Admitted From: Home Disposition:  SNF  Recommendations for Outpatient Follow-up:  1. Follow up with PCP in 1-2 weeks 2. Recommend repeat renal panel in 1-2 weeks 3. Please encourage/assist patient to eat and drink 4. Recommend make Palliative Care referral at facility. Family agrees with this  Discharge Condition:Improved CODE STATUS:Full Diet recommendation: Regular   Brief/Interim Summary: 85 year old female with history of hypertension, hypothyroidism, hyperlipidemia, dementia, gout presented with weakness, fall with fever and chills. Chest x-ray showed possible pneumonia and interstitial lung disease. She was started on IV antibiotics and intravenous fluids.  Discharge Diagnoses:  Principal Problem:   CAP (community acquired pneumonia) Active Problems:   AKI (acute kidney injury) ([place])   Essential hypertension   HLD (hyperlipidemia)   Hypothyroidism  Probable community-acquired bacterial left lower lobe pneumonia -Initially continued Rocephin and Zithromax, completed course of abx.Cultures negative so far.Urine streptococcal antigen negative. Flu a and B are negative. Currently on room air.Oxygen supplementation as needed -Follow up CXR ordered and reviewed. Vascular congestion with B pleural effusions L>R. See below.  -Large volume urine output recently noted with IV lasix.  -Diuretic now on hold following recent ARF, see below -completed course of abx  Probable pulmonary fibrosis with progression -Chest x-ray suggestive of above.  -currently on room air  Leukocytosis -Noted to improve -currently stable  Acute renal failurewith metabolic acidosis -Probably has some stage of chronic renal disease, unclear which one though. -Renal ultrasound was negative for hydronephrosis -Cr now  showing improvement with Cr of 3.65 -Tolerating gentle IVF hydration -Repeat bmet in AM with improvement in Cr -concern that patient has very limited PO intake and requires assistance with meals. Strongly recommend help with meals and ensure patient remains hydrated  Hyperlipidemia -Continue with statin as tolerated -Remains stable  Hypertension -Will continue with metoprolol and amlodipine as tolerated -BP elevated, requiring hydralazine -Increased norvasc to 10mg  with scheduled PO hydralazine- -BP currently stable  Hypothyroidism  -Continue Synthroid as tolerated -Presently stable  Possible dementia -Seems to be progressive -Pt with poor PO intake and requires assistance with meals -Recommend Palliative Care referral at facility  Generalized conditioning  -PTrecommends SNF placement. Social worker consulted   Discharge Instructions   Allergies as of 10/06/2018   No Known Allergies     Medication List    STOP taking these medications   lisinopril 10 MG tablet Commonly known as:  PRINIVIL,ZESTRIL     TAKE these medications   amLODipine 10 MG tablet Commonly known as:  NORVASC Take 1 tablet (10 mg total) by mouth daily. Start taking on:  October 07, 2018 What changed:    medication strength  how much to take   febuxostat 40 MG tablet Commonly known as:  ULORIC Take 40 mg by mouth daily.   hydrALAZINE 25 MG tablet Commonly known as:  APRESOLINE Take 1 tablet (25 mg total) by mouth every 8 (eight) hours.   levothyroxine 25 MCG tablet Commonly known as:  SYNTHROID, LEVOTHROID Take 25 mcg by mouth daily before breakfast.   metoprolol succinate 100 MG 24 hr tablet Commonly known as:  TOPROL-XL Take 100 mg by mouth daily. Take with or immediately following a meal.   rosuvastatin 5 MG tablet Commonly known as:  CRESTOR Take 2.5 mg by mouth daily.      Follow-up Information    [name], MD. Schedule an appointment as  soon as possible for a  visit in 1 week(s).   Specialty:  Internal Medicine Contact information: [address] [place] [address] [phone]          No Known Allergies  Consultations:    Procedures/Studies: Dg Chest 2 View  Result Date: 09/27/2018 CLINICAL DATA:  Fever EXAM: CHEST - 2 VIEW COMPARISON:  01/28/2009 chest radiograph. FINDINGS: Normal heart size. Mildly tortuous atherosclerotic thoracic aorta. Moderate hiatal hernia. No pneumothorax. No pleural effusion. Patchy reticular opacities at the peripheral left lung base, increased from prior. No pulmonary edema. IMPRESSION: 1. Chronic patchy reticular opacities at the peripheral left lung base, increased from 2010 radiograph, favor progression of pulmonary fibrosis, can not exclude acute superimposed pneumonia. Short-term follow-up PA and lateral chest radiographs suggested. 2. Moderate hiatal hernia. Electronically Signed   By: Ilona Sorrel M.D.   On: 09/27/2018 19:46   Dg Pelvis 1-2 Views  Result Date: 09/27/2018 CLINICAL DATA:  Urinary frequency and weakness with fever. Pelvic pain. EXAM: PELVIS - 1-2 VIEW COMPARISON:  None. FINDINGS: Lumbar spondylosis with disc space narrowing of the included lumbar spine from L4 through S1 is identified. Moderate stool retention is seen within the cecum and ascending colon partially obscuring the right iliac bone. Likewise, the sacrum is suboptimally assessed due to overlying bowel. No acute displaced appearing fracture. No joint dislocation. No suspicious radiopaque calculi. Phlebolith is noted in the right lower pelvis. IMPRESSION: Lumbar spondylosis. No acute osseous abnormality. Electronically Signed   By: Ashley Royalty M.D.   On: 09/27/2018 19:48   US Renal  Result Date: 10/03/2018 CLINICAL DATA:  Acute renal failure. EXAM: RENAL / URINARY TRACT ULTRASOUND COMPLETE COMPARISON:  Renal ultrasound, 09/28/2018. FINDINGS: Right Kidney: Renal measurements: 10 x 3.6 x 4.2 cm = volume: 80 mL.  Increased parenchymal echogenicity. Mild dilation of the renal pelvis. No defined mass or cyst. No stones. Left Kidney: Renal measurements: 10.3 x 4.3 x 3.5 cm = volume: 82 mL. Increased parenchymal echogenicity. Cortical cysts, largest from the lower pole measures 4 x 2.3 x 3.6 cm. Upper pole cyst measuring 1.1 x 0.9 x 1.1 cm. Bladder: Not visualized. Note is made of a right pleural effusion. IMPRESSION: 1. Mild right renal pelvis dilation. This is likely chronic. Consider low-grade hydronephrosis if there are consistent clinical findings. 2. Bilateral increased renal parenchymal echogenicity consistent with medical renal disease. 3. Left renal cortical cyst stable from prior ultrasound. Electronically Signed   By: Lajean Manes M.D.   On: 10/03/2018 15:35   US Renal  Result Date: 09/28/2018 CLINICAL DATA:  Acute kidney injury history of hypertension. EXAM: RENAL / URINARY TRACT ULTRASOUND COMPLETE COMPARISON:  None. FINDINGS: Right Kidney: Renal measurements: 9.8 x 5.7 x 6.6 cm = volume: 192 mL. The renal cortical echotexture remains lower than that of the adjacent liver. There is a parapelvic cyst versus mildly dilated renal pelvis measuring 3.8 x 2.4 x 2.8 cm. Left Kidney: Renal measurements: 8.0 x 3.7 x 4.3 cm = volume: 66.9 mL. Echogenicity within normal limits. There is a dominant lower pole cyst measuring 3.6 x 2.9 x 2.8 cm. A small exophytic upper pole cyst measures 1 cm in diameter. Bladder: The urinary bladder was not visualized. IMPRESSION: No hydronephrosis. Bilateral cortical cysts. No significant cortical echotexture abnormalities. Nonvisualization of the urinary bladder due to bowel gas. Electronically Signed   By: David  Martinique M.D.   On: 09/28/2018 12:55   Dg Chest Port 1 View  Result Date: 09/30/2018 CLINICAL DATA:  Cough EXAM: PORTABLE CHEST 1 VIEW COMPARISON:  09/27/2018 FINDINGS: Development of small left greater than right pleural effusions. Increased left greater than right basilar  airspace disease. Cardiomegaly with vascular congestion. Aortic atherosclerosis. No pneumothorax. IMPRESSION: 1. Cardiomegaly with central vascular congestion 2. Development of small bilateral pleural effusions left greater than right as well as left greater than right basilar airspace disease which may reflect atelectasis or pneumonia Electronically Signed   By: Donavan Foil M.D.   On: 09/30/2018 15:59     Subjective: Feels well today  Discharge Exam: Vitals:   10/05/18 2035 10/06/18 0609  BP: (!) 167/66 (!) 166/72  Pulse: 64 69  Resp: 16 14  Temp: 98.4 F (36.9 C) 97.6 F (36.4 C)  SpO2: 98% 92%   Vitals:   10/05/18 1441 10/05/18 2034 10/05/18 2035 10/06/18 0609  BP: 139/61 (!) 167/66 (!) 167/66 (!) 166/72  Pulse: 66  64 69  Resp: (!) 22  16 14   Temp: 98 F (36.7 C)  98.4 F (36.9 C) 97.6 F (36.4 C)  TempSrc: Oral  Oral Oral  SpO2: 94%  98% 92%  Weight:      Height:        General: Pt is alert, awake, not in acute distress Cardiovascular: RRR, S1/S2 +, no rubs, no gallops Respiratory: CTA bilaterally, no wheezing, no rhonchi Abdominal: Soft, NT, ND, bowel sounds + Extremities: no edema, no cyanosis   The results of significant diagnostics from this hospitalization (including imaging, microbiology, ancillary and laboratory) are listed below for reference.     Microbiology: Recent Results (from the past 240 hour(s))  Urine culture     Status: None   Collection Time: 09/27/18  5:51 PM  Result Value Ref Range Status   Specimen Description   Final    URINE, RANDOM Performed at [facility] [address] [place], [place] [address]    Special Requests   Final    NONE Performed at [facility], [place] [address], [place], [place] [address]    Culture   Final    NO GROWTH Performed at [facility], [place] [address], [place], [place] [address]    Report Status 09/29/2018 FINAL  Final  Blood Culture (routine x 2)      Status: None   Collection Time: 09/27/18  6:36 PM  Result Value Ref Range Status   Specimen Description LEFT ANTECUBITAL  Final   Special Requests   Final    BOTTLES DRAWN AEROBIC AND ANAEROBIC Blood Culture adequate volume Performed at [facility] [address] [place], [place] [address]    Culture NO GROWTH 5 DAYS  Final   Report Status 10/03/2018 FINAL  Final  Blood Culture (routine x 2)     Status: None   Collection Time: 09/27/18  6:36 PM  Result Value Ref Range Status   Specimen Description BLOOD LEFT HAND  Final   Special Requests   Final    BOTTLES DRAWN AEROBIC AND ANAEROBIC Blood Culture adequate volume Performed at [facility] [address] [place], [place] [address]    Culture NO GROWTH 5 DAYS  Final   Report Status 10/03/2018 FINAL  Final     Labs: BNP (last 3 results) No results for input(s): BNP in the last 8760 hours. Basic Metabolic Panel: Recent Labs  Lab 09/30/18 0621  10/02/18 0647 10/03/18 0650 10/04/18 0622 10/05/18 0600 10/06/18 0602  NA 141   < > 139  140 140 142 141  K 3.1*   < > 2.9* 3.4* 3.5 3.9 3.9  CL 108   < > 100 103 103 107 107  CO2 23   < > 25 25 25 23 24   GLUCOSE 136*   < > 119* 166* 124* 107* 101*  BUN 26*   < > 34* 41* 42* 42* 42*  CREATININE 2.69*   < > 3.68* 4.07* 4.10* 3.65* 3.54*  CALCIUM 8.1*   < > 8.0* 8.2* 8.3* 8.3* 8.3*  MG 1.7  --   --   --   --  1.7  --    < > = values in this interval not displayed.   Liver Function Tests: No results for input(s): AST, ALT, ALKPHOS, BILITOT, PROT, ALBUMIN in the last 168 hours. No results for input(s): LIPASE, AMYLASE in the last 168 hours. No results for input(s): AMMONIA in the last 168 hours. CBC: Recent Labs  Lab 09/30/18 0621  WBC 12.6*  NEUTROABS 9.5*  HGB 10.6*  HCT 33.6*  MCV 93.1  PLT 374   Cardiac Enzymes: No results for input(s): CKTOTAL, CKMB, CKMBINDEX, TROPONINI in the last 168 hours. BNP: Invalid input(s):  POCBNP CBG: No results for input(s): GLUCAP in the last 168 hours. D-Dimer No results for input(s): DDIMER in the last 72 hours. Hgb A1c No results for input(s): HGBA1C in the last 72 hours. Lipid Profile No results for input(s): CHOL, HDL, LDLCALC, TRIG, CHOLHDL, LDLDIRECT in the last 72 hours. Thyroid function studies No results for input(s): TSH, T4TOTAL, T3FREE, THYROIDAB in the last 72 hours.  Invalid input(s): FREET3 Anemia work up No results for input(s): VITAMINB12, FOLATE, FERRITIN, TIBC, IRON, RETICCTPCT in the last 72 hours. Urinalysis    Component Value Date/Time   COLORURINE YELLOW 09/27/2018 1751   APPEARANCEUR CLEAR 09/27/2018 1751   LABSPEC 1.019 09/27/2018 1751   PHURINE 6.0 09/27/2018 1751   GLUCOSEU 150 (A) 09/27/2018 1751   HGBUR NEGATIVE 09/27/2018 1751   BILIRUBINUR NEGATIVE 09/27/2018 1751   KETONESUR NEGATIVE 09/27/2018 1751   PROTEINUR >=300 (A) 09/27/2018 1751   UROBILINOGEN 0.2 01/28/2009 1325   NITRITE NEGATIVE 09/27/2018 1751   LEUKOCYTESUR NEGATIVE 09/27/2018 1751   Sepsis Labs Invalid input(s): PROCALCITONIN,  WBC,  LACTICIDVEN Microbiology Recent Results (from the past 240 hour(s))  Urine culture     Status: None   Collection Time: 09/27/18  5:51 PM  Result Value Ref Range Status   Specimen Description   Final    URINE, RANDOM Performed at [facility], [place] [address], [place], [place] [address]    Special Requests   Final    NONE Performed at [facility], [place] [address], [place], [place] [address]    Culture   Final    NO GROWTH Performed at [facility], [place] [address] [place], [place] [address]    Report Status 09/29/2018 FINAL  Final  Blood Culture (routine x 2)     Status: None   Collection Time: 09/27/18  6:36 PM  Result Value Ref Range Status   Specimen Description LEFT ANTECUBITAL  Final   Special Requests   Final    BOTTLES DRAWN AEROBIC AND ANAEROBIC Blood Culture adequate  volume Performed at [facility] [address] [place], [place] [address]    Culture NO GROWTH 5 DAYS  Final   Report Status 10/03/2018 FINAL  Final  Blood Culture (routine x 2)     Status: None  Collection Time: 09/27/18  6:36 PM  Result Value Ref Range Status   Specimen Description BLOOD LEFT HAND  Final   Special Requests   Final    BOTTLES DRAWN AEROBIC AND ANAEROBIC Blood Culture adequate volume Performed at [facility] [address] [place], [place] [address]    Culture NO GROWTH 5 DAYS  Final   Report Status 10/03/2018 FINAL  Final   Time spent: 62min  SIGNED:   Marylu Lund, MD  [facility] 10/06/2018, 2:16 PM  If 7PM-7AM, please contact night-coverage

## 2018-10-06 NOTE — Progress Notes (Signed)
Physical Therapy Treatment Patient Details Name: [name] MRN: [id number] DOB: [birth year] Today's Date: 10/06/2018    History of Present Illness  85 y.o. female with history of hypertension, hypothyroidism, hyperlipidemia, gout was brought to the ER 09/27/18 with  weakness, fall at home, cough, fever, dysuria.  Chest x-ray shows possible pneumonia and interstitial lung disease    PT Comments    Pt able to tolerate SPT to recliner today with MOD A.  Her o2 was 95-96% on room air.  Continue to recommend SNF.   Follow Up Recommendations  SNF     Equipment Recommendations  None recommended by PT    Recommendations for Other Services       Precautions / Restrictions Precautions Precautions: Fall Precaution Comments: thin skin Restrictions Weight Bearing Restrictions: No    Mobility  Bed Mobility Overal bed mobility: Needs Assistance Bed Mobility: Supine to Sit     Supine to sit: Min assist;HOB elevated     General bed mobility comments: A for trunk. o2 95% on RA  Transfers Overall transfer level: Needs assistance   Transfers: Stand Pivot Transfers   Stand pivot transfers: Mod assist       General transfer comment: Cues for hand placement and blocking of LE to avoid sliding.  o2 96% on RA after transfer  Ambulation/Gait                 Stairs             Wheelchair Mobility    Modified Rankin (Stroke Patients Only)       Balance   Sitting-balance support: Feet supported Sitting balance-Leahy Scale: Fair     Standing balance support: Bilateral upper extremity supported Standing balance-Leahy Scale: Poor                              Cognition Arousal/Alertness: Awake/alert Behavior During Therapy: WFL for tasks assessed/performed Overall Cognitive Status: Impaired/Different from baseline Area of Impairment: Orientation;Safety/judgement                 Orientation Level: Place;Time;Situation;Disoriented  to   Memory: Decreased recall of precautions;Decreased short-term memory   Safety/Judgement: Decreased awareness of safety;Decreased awareness of deficits     General Comments: pleasant      Exercises      General Comments        Pertinent Vitals/Pain Pain Assessment: No/denies pain    Home Living                      Prior Function            PT Goals (current goals can now be found in the care plan section) Acute Rehab PT Goals Patient Stated Goal: agreeable to get OOB PT Goal Formulation: Patient unable to participate in goal setting Time For Goal Achievement: 10/12/18 Potential to Achieve Goals: Fair Progress towards PT goals: Progressing toward goals    Frequency    Min 2X/week      PT Plan Current plan remains appropriate    Co-evaluation              AM-PAC PT "6 Clicks" Mobility   Outcome Measure  Help needed turning from your back to your side while in a flat bed without using bedrails?: A Lot Help needed moving from lying on your back to sitting on the side of a flat bed without using bedrails?: A Lot Help needed  moving to and from a bed to a chair (including a wheelchair)?: A Lot Help needed standing up from a chair using your arms (e.g., wheelchair or bedside chair)?: A Lot Help needed to walk in hospital room?: Total Help needed climbing 3-5 steps with a railing? : Total 6 Click Score: 10    End of Session Equipment Utilized During Treatment: Gait belt Activity Tolerance: Patient tolerated treatment well Patient left: in chair;with call bell/phone within reach;with chair alarm set(chair alarm hooked up. NT getting new batteries for machine) Nurse Communication: Mobility status PT Visit Diagnosis: Unsteadiness on feet (R26.81);Muscle weakness (generalized) (M62.81);Difficulty in walking, not elsewhere classified (R26.2);History of falling (Z91.81)     Time: 3825-0539 PT Time Calculation (min) (ACUTE ONLY): 19  min  Charges:  $Therapeutic Activity: 8-22 mins                     [name], [place] Pager [phone] 10/06/2018    [name] 10/06/2018, 11:41 AM

## 2018-10-13 DIAGNOSIS — E782 Mixed hyperlipidemia: Secondary | ICD-10-CM | POA: Diagnosis not present

## 2018-10-13 DIAGNOSIS — E039 Hypothyroidism, unspecified: Secondary | ICD-10-CM | POA: Diagnosis not present

## 2018-10-13 DIAGNOSIS — I10 Essential (primary) hypertension: Secondary | ICD-10-CM | POA: Diagnosis not present

## 2018-10-21 ENCOUNTER — Other Ambulatory Visit: Payer: Self-pay | Admitting: *Deleted

## 2018-10-21 NOTE — Patient Outreach (Signed)
[facility]) Care Management  10/21/2018  [name] Feb 15, 1918 [id number]   Onsite visit to facility.  Met with [name], discharge planner.  She reports that patient is discharging home next week. Patient will return home with spouse, and son who has lived in home for 10 years to take care of both patient and her spouse.  Facility will order home care services.   Met patient at bedside, she was sitting in w/c. No family was present.  Left a THN packet, patient was pleasantly confused.  Will attempt to outreach patient son and discuss [facility] services.  Royetta Crochet. Laymond Purser, MSN, RN, [employer] , [place] [phone]) Business Cell  [phone]) Toll Free Office

## 2018-11-02 ENCOUNTER — Other Ambulatory Visit: Payer: Self-pay

## 2018-11-02 NOTE — Patient Outreach (Signed)
[facility]) Care Management  11/02/2018  Jovi Alvizo 03/25/1918 [id number]    EMMI-General Discharge RED ON [place] Day # 4 Date: 10/30/2018 Red Alert Reason: "Got discharge papers? I don't know"   Outreach attempt #1 to patient. No answer. RN CM left HIPAA compliant voicemail message along with contact info.       Plan: RN CM will make outreach attempt to patient within 3-4 business days. RN CM will send unsuccessful outreach letter to patient.  Enzo [place], RN,BSN,CCM [facility] Management Telephonic Care Management Coordinator Direct Phone: [phone] Toll Free: [phone] Fax: [phone]

## 2018-11-03 ENCOUNTER — Other Ambulatory Visit: Payer: Self-pay

## 2018-11-03 NOTE — Patient Outreach (Signed)
[facility]) Care Management  11/03/2018  [name] April 16, 1918 [id number]    EMMI-General Discharge RED ON [place] Day # 4 Date: 10/30/2018 Red Alert Reason: "Got discharge papers? I don't know"    No further outreach attempts warranted at this time. Upon further chart review noted that [facility] and remains active.     Plan: RN CM will close case at this time.   Enzo [place], RN,BSN,CCM [place] Management Telephonic Care Management Coordinator Direct Phone: [phone] Toll Free: [phone] Fax: [phone]

## 2018-11-26 DIAGNOSIS — L03031 Cellulitis of right toe: Secondary | ICD-10-CM | POA: Diagnosis not present

## 2018-11-26 DIAGNOSIS — M79675 Pain in left toe(s): Secondary | ICD-10-CM | POA: Diagnosis not present

## 2018-11-26 DIAGNOSIS — I739 Peripheral vascular disease, unspecified: Secondary | ICD-10-CM | POA: Diagnosis not present

## 2018-11-26 DIAGNOSIS — B351 Tinea unguium: Secondary | ICD-10-CM | POA: Diagnosis not present

## 2018-11-26 DIAGNOSIS — L02611 Cutaneous abscess of right foot: Secondary | ICD-10-CM | POA: Diagnosis not present

## 2018-12-30 DEATH — deceased

## 2019-05-20 IMAGING — CR DG PELVIS 1-2V
2 series · 2 of 2 positions shown · non-contrast
Comparison: None.

CLINICAL DATA: Urinary frequency and weakness with fever. Pelvic
pain.

EXAM:
PELVIS - 1-2 VIEW

[x pelvis (1 of 2)]
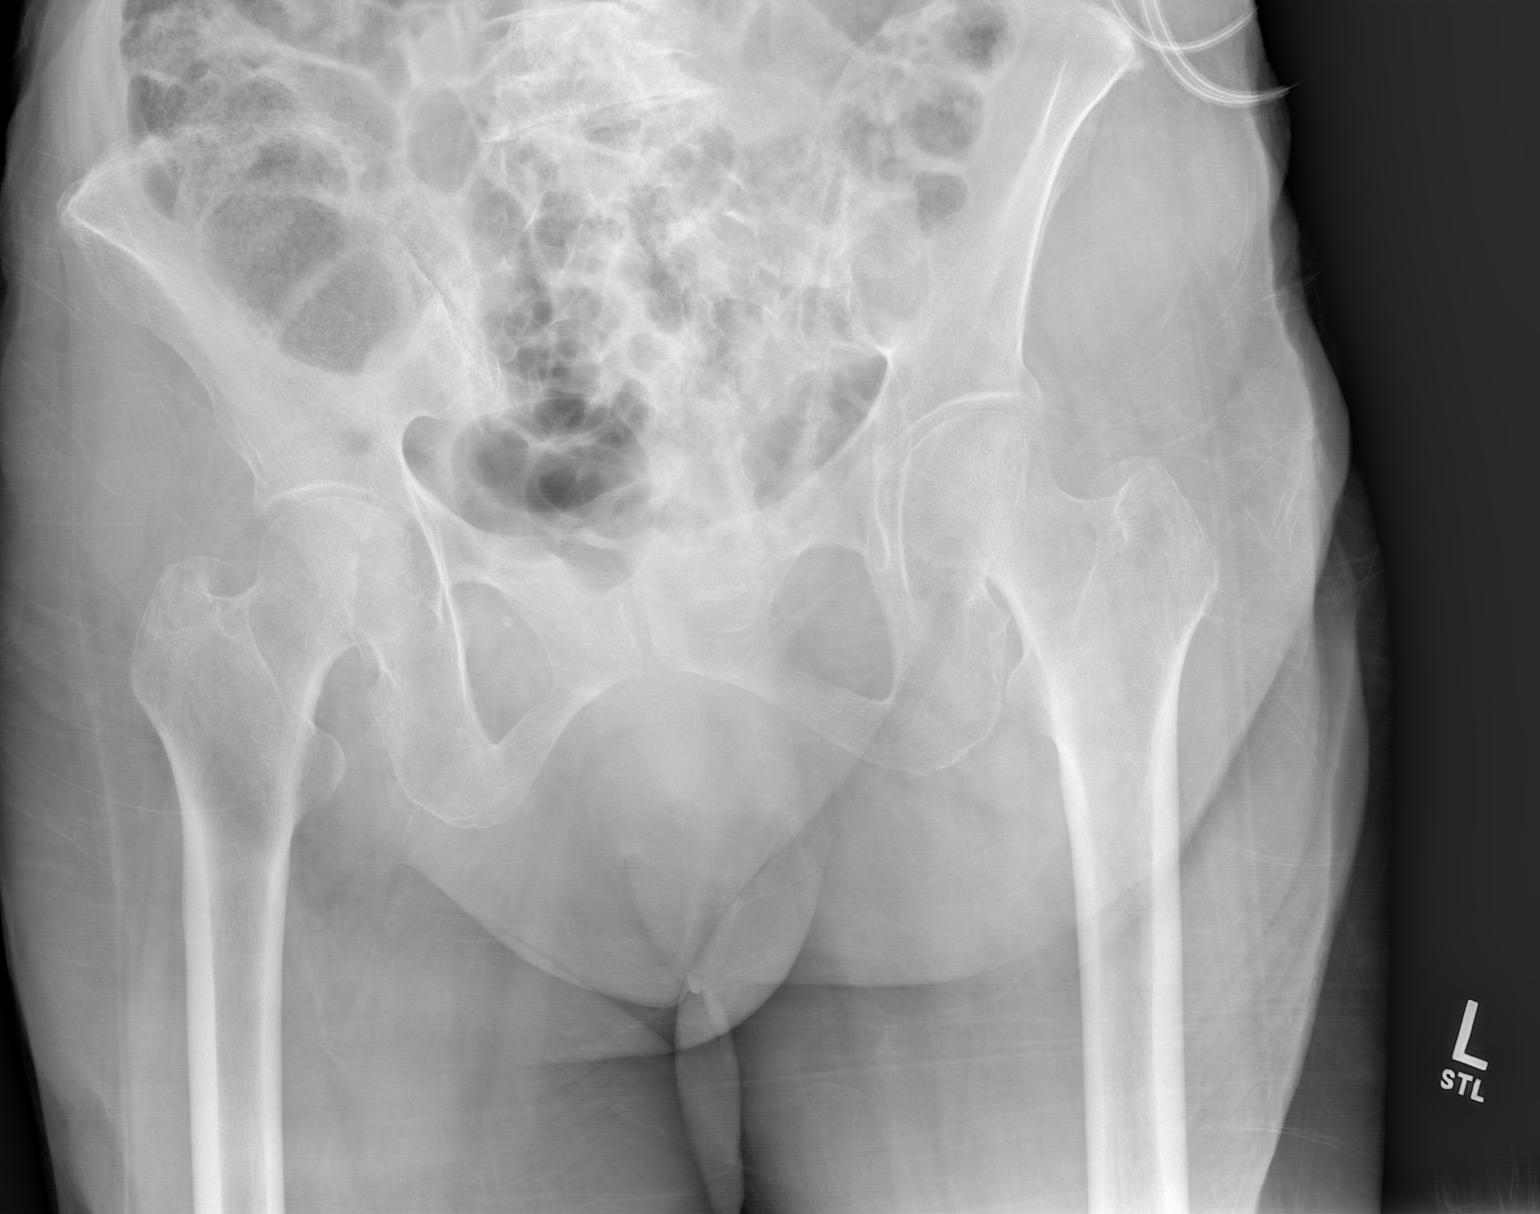

[x pelvis (2 of 2)]
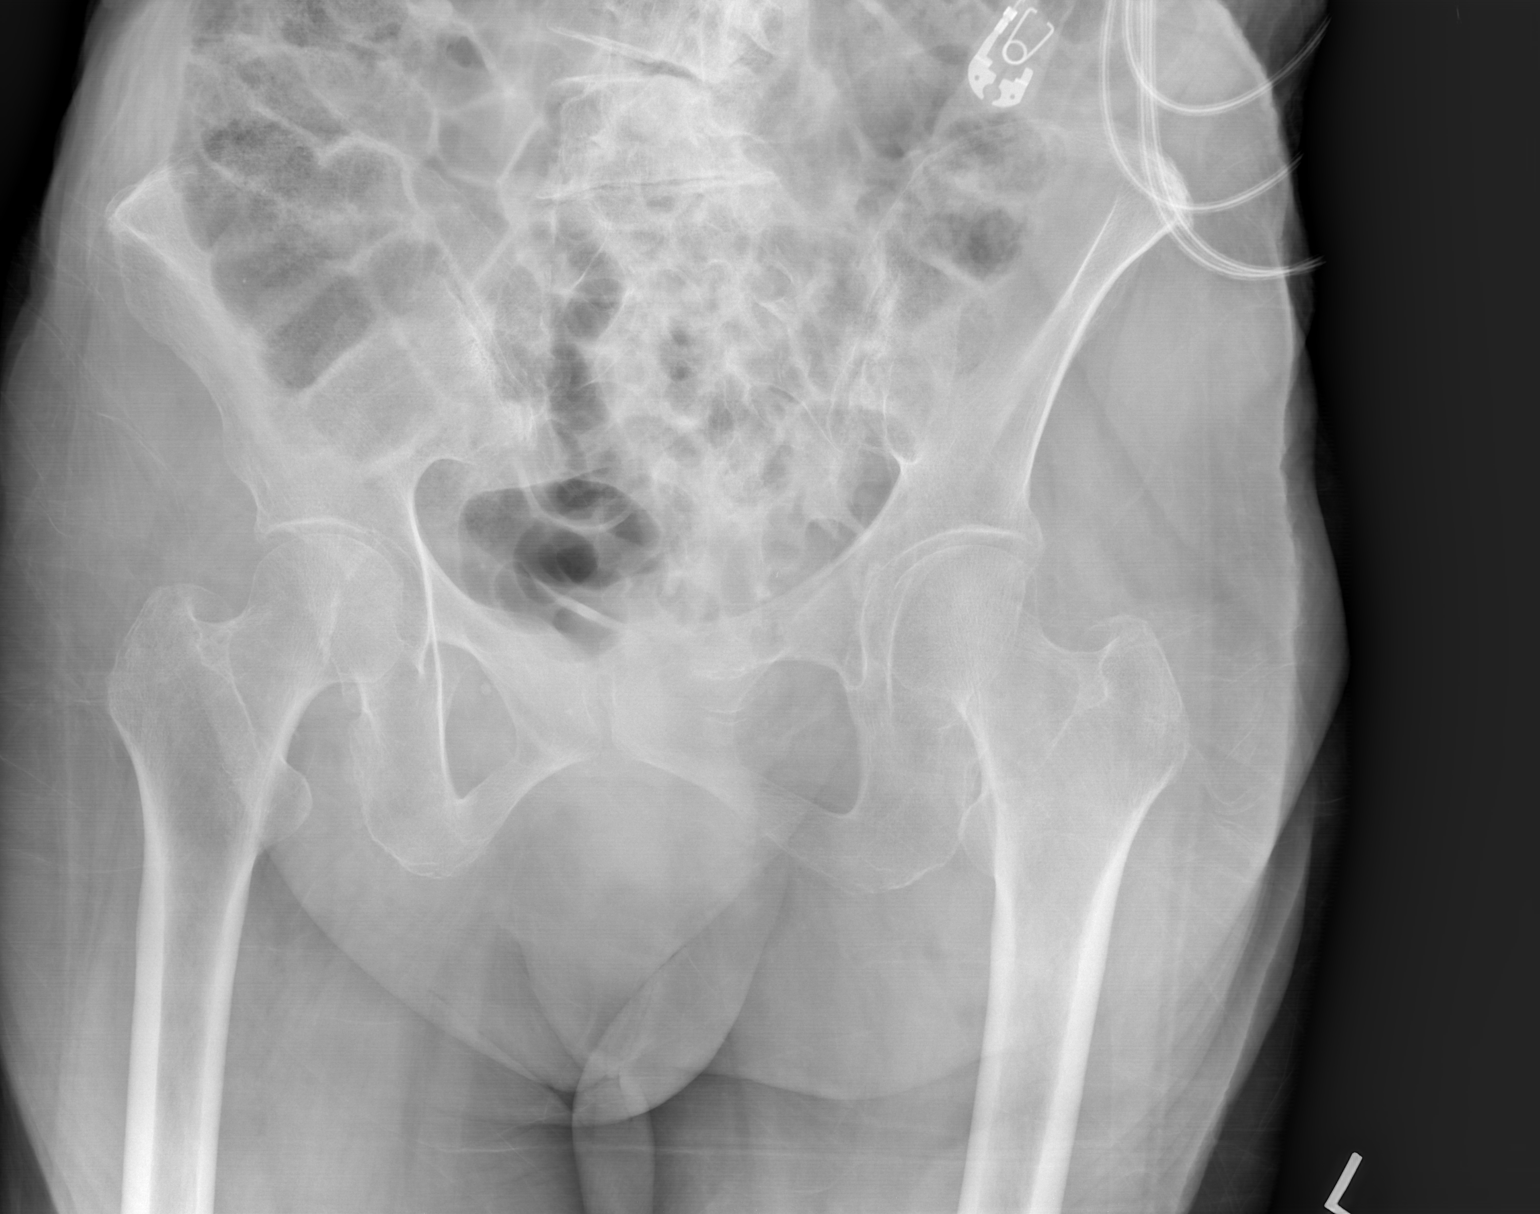

[2 of 2 positions shown; findings below may reference images not displayed]

FINDINGS: Lumbar spondylosis with disc space narrowing of the included lumbar
spine from L4 through S1 is identified. Moderate stool retention is
seen within the cecum and ascending colon partially obscuring the
right iliac bone. Likewise, the sacrum is suboptimally assessed due
to overlying bowel. No acute displaced appearing fracture. No joint
dislocation. No suspicious radiopaque calculi. Phlebolith is noted
in the right lower pelvis.
IMPRESSION: Lumbar spondylosis. No acute osseous abnormality.

## 2019-05-23 IMAGING — DX DG CHEST 1V PORT
1 series · 1 of 1 positions shown · non-contrast
Comparison: 09/27/2018

CLINICAL DATA: Cough

EXAM:
PORTABLE CHEST 1 VIEW

[chest ap]
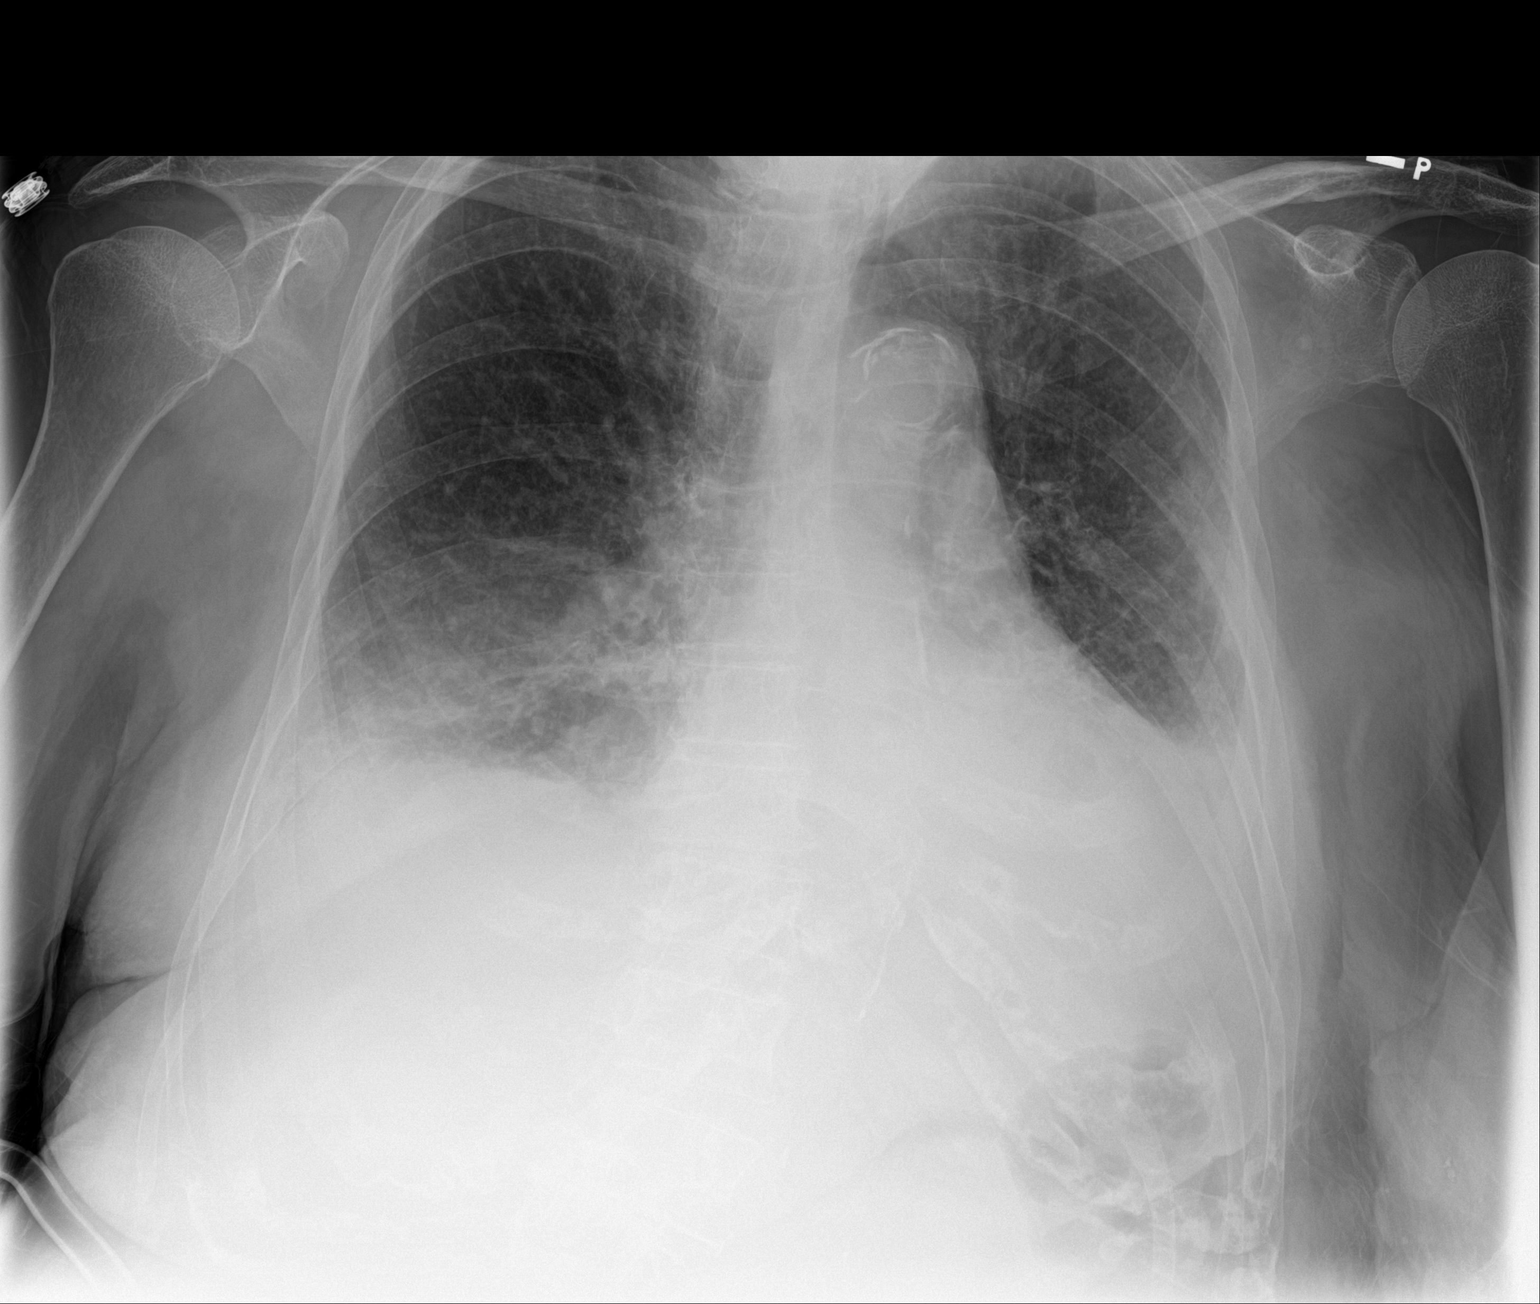

[1 of 1 positions shown; findings below may reference images not displayed]

FINDINGS: Development of small left greater than right pleural effusions.
Increased left greater than right basilar airspace disease.
Cardiomegaly with vascular congestion. Aortic atherosclerosis. No
pneumothorax.
IMPRESSION: 1. Cardiomegaly with central vascular congestion
2. Development of small bilateral pleural effusions left greater
than right as well as left greater than right basilar airspace
disease which may reflect atelectasis or pneumonia

## 2020-06-10 IMAGING — US US RENAL
1 series · 14 of 25 positions shown · non-contrast
Comparison: None.

CLINICAL DATA: Acute kidney injury history of hypertension.

EXAM:
RENAL / URINARY TRACT ULTRASOUND COMPLETE

[Series 1: us renal · 0.19mm/px · 14 of 53 slices shown]
[im 1/53]
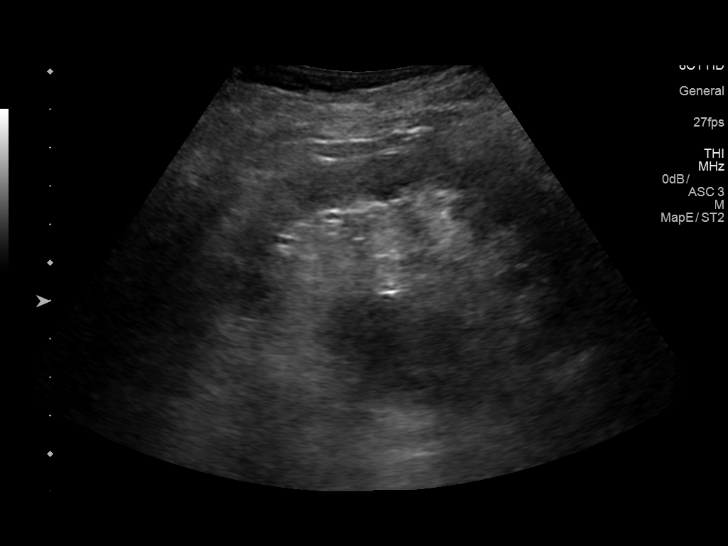
[im 5/53]
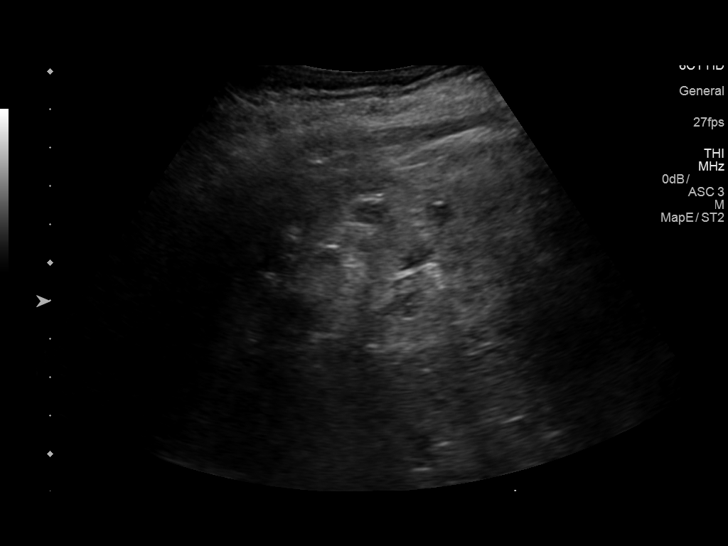
[im 9/53]
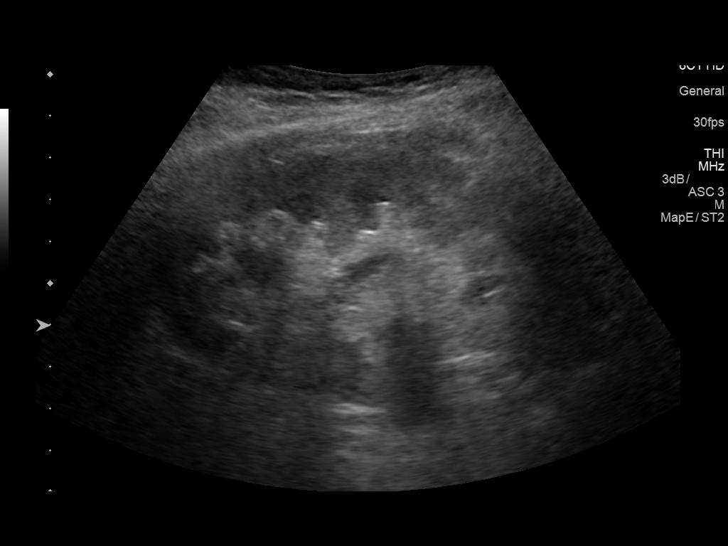
[im 14/53]
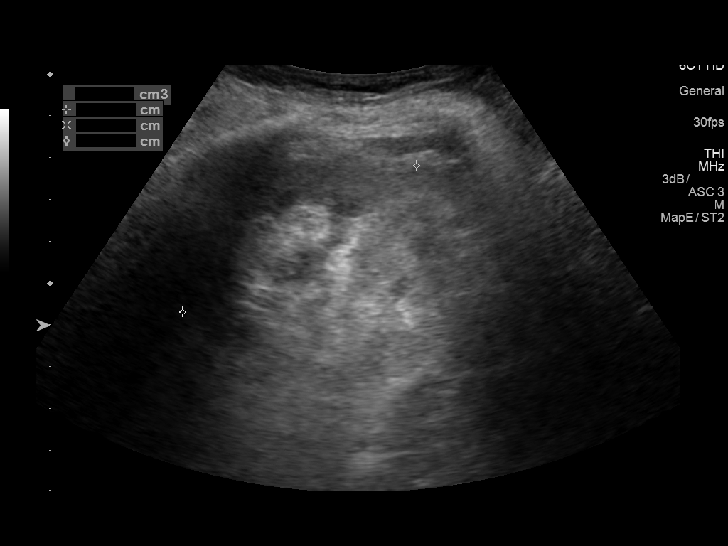
[im 18/53]
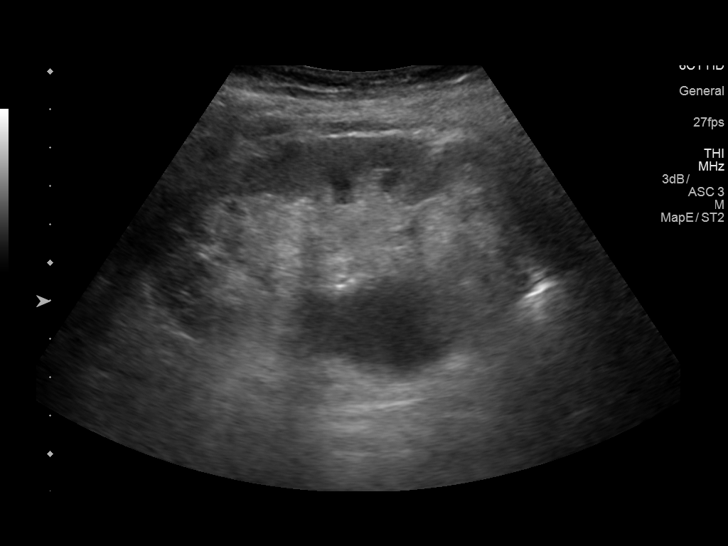
[im 20/53]
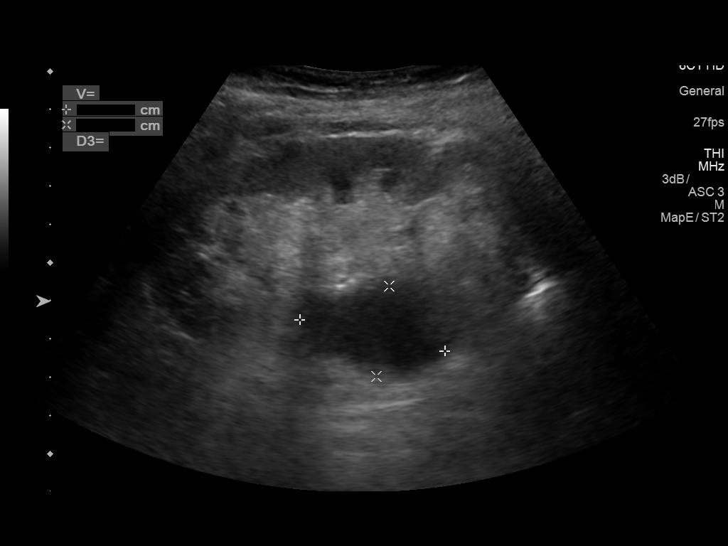
[im 24/53]
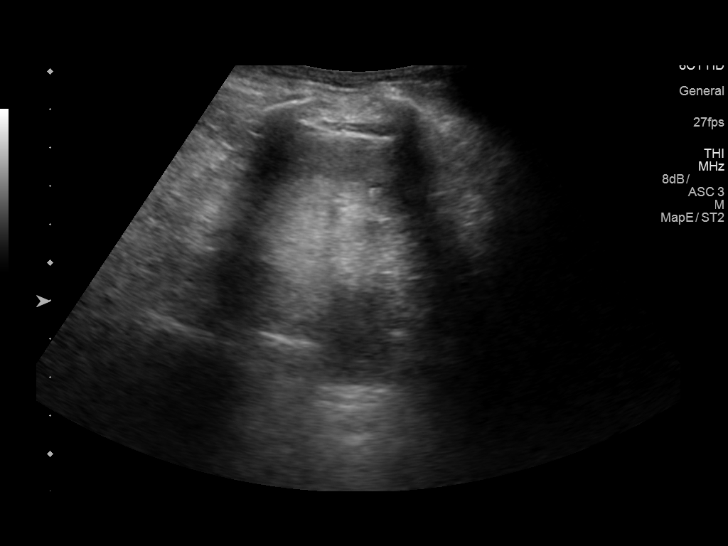
[im 29/53]
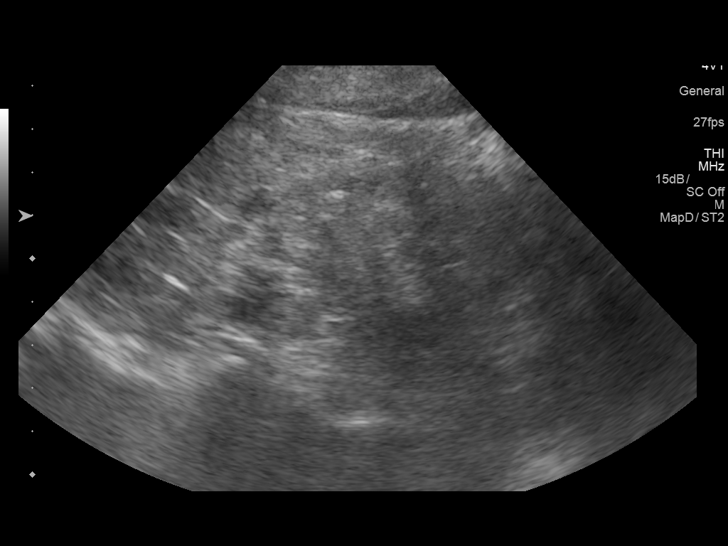
[im 33/53]
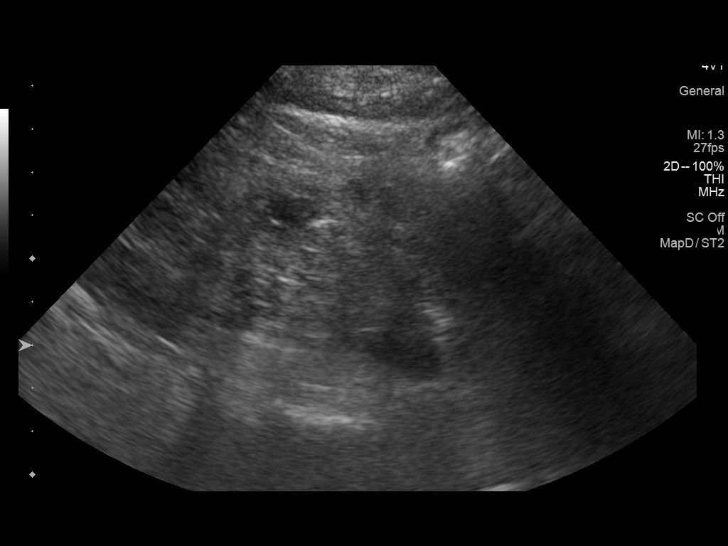
[im 35/53]
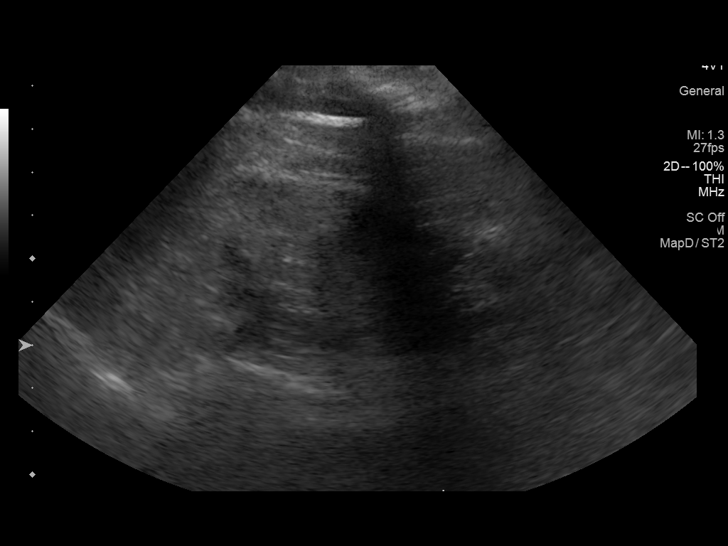
[im 40/53]
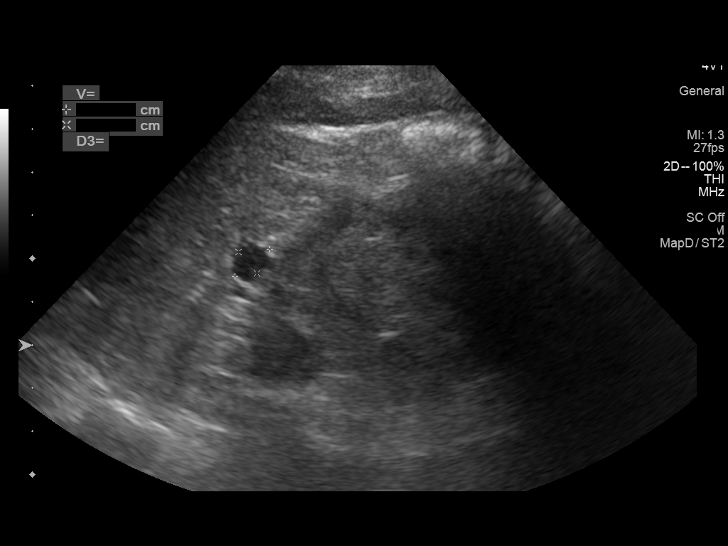
[im 44/53]
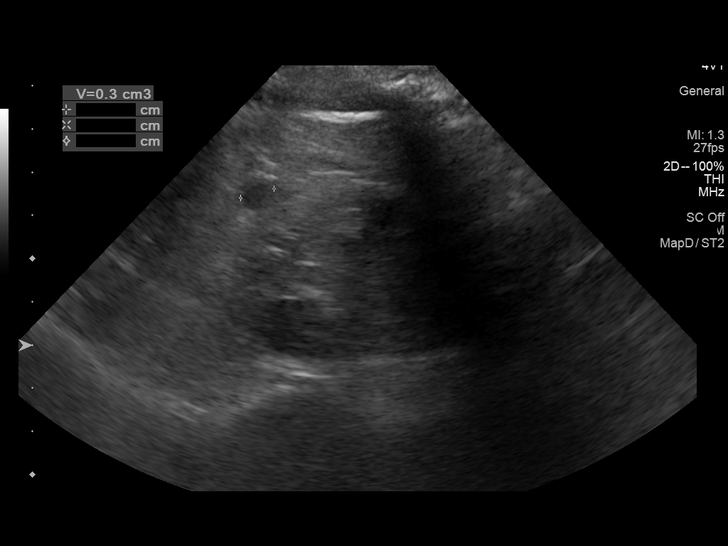
[im 48/53]
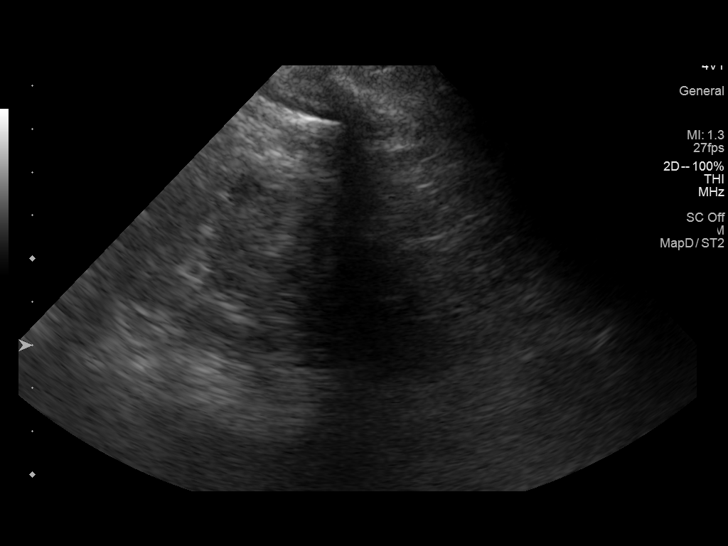
[im 53/53]
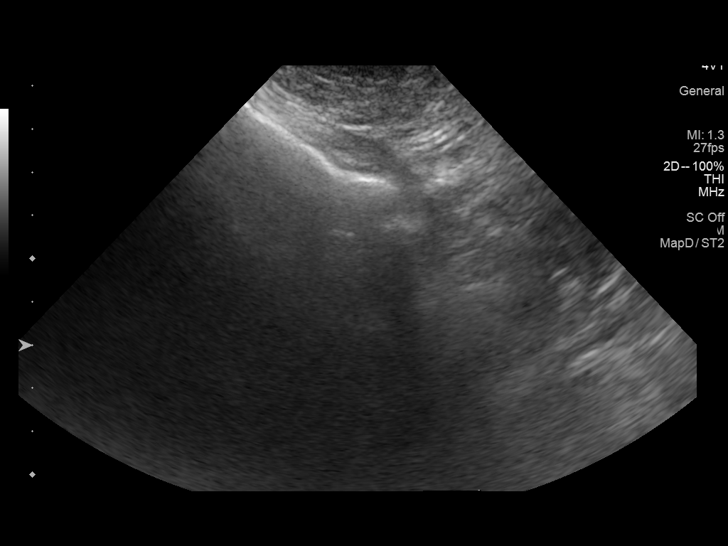

[14 of 25 positions shown; findings below may reference images not displayed]

FINDINGS: Right Kidney:

Renal measurements: 9.8 x 5.7 x 6.6 cm = volume: 192 mL. The renal
cortical echotexture remains lower than that of the adjacent liver.
There is a parapelvic cyst versus mildly dilated renal pelvis
measuring 3.8 x 2.4 x 2.8 cm.

Left Kidney:

Renal measurements: 8.0 x 3.7 x 4.3 cm = volume: 66.9 mL.
Echogenicity within normal limits. There is a dominant lower pole
cyst measuring 3.6 x 2.9 x 2.8 cm. A small exophytic upper pole cyst
measures 1 cm in diameter.

Bladder:

The urinary bladder was not visualized.
IMPRESSION: No hydronephrosis. Bilateral cortical cysts. No significant cortical
echotexture abnormalities. Nonvisualization of the urinary bladder
due to bowel gas.
